# Patient Record
Sex: Male | Born: 1945 | Race: White | Hispanic: No | Marital: Married | Smoking: Former smoker
Health system: Southern US, Community
[De-identification: ages and names within clinical notes are randomized; demographics above are authoritative.]

## PROBLEM LIST (undated history)

## (undated) DIAGNOSIS — E785 Hyperlipidemia, unspecified: Secondary | ICD-10-CM

## (undated) DIAGNOSIS — R51 Headache: Secondary | ICD-10-CM

## (undated) DIAGNOSIS — K219 Gastro-esophageal reflux disease without esophagitis: Secondary | ICD-10-CM

## (undated) DIAGNOSIS — C4490 Unspecified malignant neoplasm of skin, unspecified: Secondary | ICD-10-CM

## (undated) DIAGNOSIS — R519 Headache, unspecified: Secondary | ICD-10-CM

## (undated) DIAGNOSIS — I10 Essential (primary) hypertension: Secondary | ICD-10-CM

## (undated) HISTORY — DX: Essential (primary) hypertension: I10

## (undated) HISTORY — DX: Hyperlipidemia, unspecified: E78.5

## (undated) HISTORY — PX: CHOLECYSTECTOMY: SHX55

## (undated) HISTORY — PX: CEREBRAL ANEURYSM REPAIR: SHX164

## (undated) HISTORY — DX: Headache, unspecified: R51.9

## (undated) HISTORY — DX: Unspecified malignant neoplasm of skin, unspecified: C44.90

## (undated) HISTORY — DX: Headache: R51

## (undated) HISTORY — DX: Gastro-esophageal reflux disease without esophagitis: K21.9

---
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
## 1998-08-28 ENCOUNTER — Ambulatory Visit (HOSPITAL_BASED_OUTPATIENT_CLINIC_OR_DEPARTMENT_OTHER): Admission: RE | Admit: 1998-08-28 | Discharge: 1998-08-28 | Payer: Self-pay | Admitting: Plastic Surgery

## 2001-10-20 ENCOUNTER — Other Ambulatory Visit: Admission: RE | Admit: 2001-10-20 | Discharge: 2001-10-20 | Payer: Self-pay | Admitting: Internal Medicine

## 2002-01-19 ENCOUNTER — Ambulatory Visit (HOSPITAL_COMMUNITY): Admission: RE | Admit: 2002-01-19 | Discharge: 2002-01-19 | Payer: Self-pay | Admitting: Internal Medicine

## 2002-08-22 ENCOUNTER — Ambulatory Visit (HOSPITAL_COMMUNITY): Admission: RE | Admit: 2002-08-22 | Discharge: 2002-08-22 | Payer: Self-pay | Admitting: Family Medicine

## 2002-08-22 ENCOUNTER — Encounter: Payer: Self-pay | Admitting: Family Medicine

## 2002-08-29 ENCOUNTER — Ambulatory Visit (HOSPITAL_COMMUNITY): Admission: RE | Admit: 2002-08-29 | Discharge: 2002-08-29 | Payer: Self-pay | Admitting: Family Medicine

## 2002-08-29 ENCOUNTER — Encounter: Payer: Self-pay | Admitting: Family Medicine

## 2002-09-05 ENCOUNTER — Ambulatory Visit (HOSPITAL_COMMUNITY): Admission: RE | Admit: 2002-09-05 | Discharge: 2002-09-05 | Payer: Self-pay | Admitting: Family Medicine

## 2002-09-05 ENCOUNTER — Encounter: Payer: Self-pay | Admitting: Family Medicine

## 2002-09-12 ENCOUNTER — Encounter (HOSPITAL_COMMUNITY): Admission: RE | Admit: 2002-09-12 | Discharge: 2002-10-12 | Payer: Self-pay | Admitting: *Deleted

## 2002-12-06 ENCOUNTER — Ambulatory Visit (HOSPITAL_COMMUNITY): Admission: RE | Admit: 2002-12-06 | Discharge: 2002-12-06 | Payer: Self-pay | Admitting: Family Medicine

## 2002-12-06 ENCOUNTER — Encounter: Payer: Self-pay | Admitting: Family Medicine

## 2004-04-29 ENCOUNTER — Ambulatory Visit: Payer: Self-pay | Admitting: Internal Medicine

## 2004-05-01 ENCOUNTER — Ambulatory Visit (HOSPITAL_COMMUNITY): Admission: RE | Admit: 2004-05-01 | Discharge: 2004-05-01 | Payer: Self-pay

## 2004-07-11 ENCOUNTER — Emergency Department: Payer: Self-pay | Admitting: General Practice

## 2004-07-17 ENCOUNTER — Ambulatory Visit (HOSPITAL_COMMUNITY): Admission: RE | Admit: 2004-07-17 | Discharge: 2004-07-17 | Payer: Self-pay | Admitting: Family Medicine

## 2005-08-20 ENCOUNTER — Ambulatory Visit: Payer: Self-pay | Admitting: Internal Medicine

## 2005-08-24 ENCOUNTER — Ambulatory Visit: Payer: Self-pay | Admitting: Internal Medicine

## 2007-06-28 ENCOUNTER — Ambulatory Visit (HOSPITAL_COMMUNITY): Admission: RE | Admit: 2007-06-28 | Discharge: 2007-06-28 | Payer: Self-pay | Admitting: Internal Medicine

## 2007-06-28 ENCOUNTER — Ambulatory Visit: Payer: Self-pay | Admitting: Internal Medicine

## 2007-06-28 HISTORY — PX: COLONOSCOPY: SHX174

## 2010-09-16 NOTE — Op Note (Signed)
NAMEHAYDIN, [name] [name]                ACCOUNT NO.:  [id number]   MEDICAL RECORD NO.:  [id number]          PATIENT TYPE:  AMB   LOCATION:  DAY                           FACILITY:  [facility]   PHYSICIAN:  R. Roetta Sessions, M.D. DATE OF BIRTH:  Sep 18, 1945   DATE OF PROCEDURE:  06/28/2007  DATE OF DISCHARGE:                               OPERATIVE REPORT   PROCEDURE PERFORMED:  High risk screening colonoscopy.   INDICATIONS FOR PROCEDURE:  Patient is a 65 year old gentleman with  positive family history of colon cancer in his mom, who underwent his  last colonoscopy in 2003 where only left-sided diverticula were found.  He is devoid of any lower GI tract symptoms.  He is here for high-risk  screening.  This approach has discussed with the patient at length.  Potential risks, benefits, alternatives and limitations have been  reviewed, questions answered.  He is agreeable. Please see documentation  in the medical record.   PROCEDURE NOTE:  O2 saturation, blood pressure, pulse and respirations  were monitored throughout the entirety of procedure.   CONSCIOUS SEDATION:  Versed 6 mg IV, Demerol 125 mg IV in divided doses.   INSTRUMENT:  Pentax video chip system.   FINDINGS:  Digital rectal exam revealed no abnormalities.   ENDOSCOPIC FINDINGS:  The prep was adequate, relatively poor in the  ascending segment.  Colonic mucosa was surveyed from the rectosigmoid  junction through the left, transverse, right colon to the area of  appendiceal orifice, ileocecal valve and cecum.  These structures well  seen and photographed for the record.  From this level scope was slowly  withdrawn.  All previously mentioned mucosal surfaces were again seen.  The patient was noted to have sigmoid diverticula.  A thin coating of  stool lining the ascending segment which was difficult to wash away.  There is no obvious mucosal lesions seen in this segment.  The scope was  pulled down to the rectum where a thorough  examination of the rectal  mucosa including retroflex view of the anal verge demonstrated no  abnormalities.  The patient tolerated the procedure well, was reacted in  endoscopy.   IMPRESSION:  Normal rectum, left-sided diverticula.  Remainder of  colonic mucosa appeared normal, suboptimal prep of ascending segment  made exam more difficult in this area.   RECOMMENDATIONS:  1. Diverticulosis literature provided to Mr. Schnorr.  2. Return for repeat screening colonoscopy in five years.      Jonathon Bellows, M.D.  Electronically Signed     RMR/MEDQ  D:  06/28/2007  T:  06/29/2007  Job:  [id number]   cc:   Patrica Duel, M.D.  Fax: [phone]

## 2010-09-19 NOTE — Procedures (Signed)
   NAME:  [name] [name], [name] [name]                          ACCOUNT NO.:  [id number]   MEDICAL RECORD NO.:  [id number]                   PATIENT TYPE:  OUT   LOCATION:  RAD                                  FACILITY:  [facility]   PHYSICIAN:  Vida Roller, M.D.                DATE OF BIRTH:  [birth year]   DATE OF PROCEDURE:  09/12/2002  DATE OF DISCHARGE:                                    STRESS TEST   EXERCISE CARDIOLITE   INDICATION:  The patient is a 65 year old gentleman with no known coronary  artery disease and an abnormal chest x-ray with lung mass who presented for  evaluation of dyspnea on exertion and occasional shortness of breath at  rest.  He denies any chest discomfort or heart failure symptoms.  He may be  undergoing a biopsy procedure or surgery for lung mass and therefore will  need surgical clearance.  His cardiac risk factors include family history,  tobacco abuse, male sex, hypertension, and hyperlipidemia.   BASELINE DATA:  EKG shows sinus rhythm at 64 beats per minute with  nonspecific ST abnormalities.  Blood pressure is 142/80.   DESCRIPTION OF PROCEDURE:  The patient exercised for a total of eight  minutes 14 seconds to Bruce protocol stage 3 and 10.1 METS.  He denied any  cardiopulmonary symptoms.  This test was stopped secondary to fatigue.   STRESS DATA:  EKG showed a two-beat run of ventricular tachycardia about 30  seconds into recovery.  Maximum heart rate achieved was 149 beats per minute  which was 91% of predicted maximum.  Maximum blood pressure was 210/90.  EKG  showed no ischemic changes.   Final images and results are pending M.D. review.     Amy Mercy Riding, P.A. [facility]                     Vida Roller, M.D.    AB/MEDQ  D:  09/12/2002  T:  09/12/2002  Job:  [id number]

## 2010-09-19 NOTE — Group Therapy Note (Signed)
   NAMESAMARTH, OGLE                            ACCOUNT NO.:  [id number]   MEDICAL RECORD NO.:  [id number]                   PATIENT TYPE:  AMB   LOCATION:  DAY                                  FACILITY:  [facility]   PHYSICIAN:  Gerrit Friends. Rourk, M.D.               DATE OF BIRTH:  1945-08-03   DATE OF PROCEDURE:  DATE OF DISCHARGE:  01/19/2002                                   PROGRESS NOTE   INDICATIONS FOR PROCEDURE:  The patient is a 65 year old gentleman with a  positive family history of colorectal neoplasia.  He comes for high-risk  screening colonoscopy.  He is devoid of any lower GI tract symptoms.  Colonoscopy is now being done as a high-risk screening maneuver.  The  procedure has been discussed with the patient previously.  Potential risks,  benefits, and alternatives have been reviewed and questions answered and he  is agreeable.  Please see the documentation in medical record.  He is low-  risk for conscious sedation.   PROCEDURE NOTE:  O2 saturation, blood pressure, pulse, and respirations were  monitored throughout the entire procedure.   CONSCIOUS SEDATION:  Versed 3 mg IV, Demerol 75 mg IV in divided doses.   INSTRUMENT:  Olympus video chip colonoscope.   FINDINGS:  Digital rectal exam revealed no abnormalities.  Endoscopic prep  was good.   RECTAL AND COLON EXAMINATION:  The rectal mucosa including retroflexed view  of the anal verge revealed only internal hemorrhoids.   COLON:  The colonic mucosa was surveyed from the rectosigmoid junction  through the left transverse and right colon to the area to the area of the  appendiceal orifice, cecal valve, and cecum. These structures were well seen  and photographed for the records.  The patient was noted to have scattered  left-sided diverticula.  The remainder of the colonic mucosa to the cecum  appeared normal from the level of the cecum and ileocecal valve.  The scope  was slowly and cautiously withdrawn.  All  previously  mentioned mucosal  surfaces were again seen and no other abnormalities were observed. The  patient tolerated the procedure well and was reactive.   ENDOSCOPY IMPRESSION:  1. Internal hemorrhoids, otherwise normal rectum.  2. Left-sided diverticula and the remainder of colonic mucosa appeared     normal.   RECOMMENDATIONS:  1. Diverticulosis literature provided to the patient.  2. Repeat colonoscopy in five years.                                               Gerrit Friends. Rourk, M.D.   RMR/MEDQ  D:  01/19/2002  T:  01/23/2002  Job:  [id number]

## 2010-09-19 NOTE — H&P (Signed)
NAME:  [name] [name], [name] [name]                      ACCOUNT NO.:  [id number]   MEDICAL RECORD NO.:  [id number]                   PATIENT TYPE:   LOCATION:                                       FACILITY:   PHYSICIAN:  Gerrit Friends. Rourk, M.D.               DATE OF BIRTH:  05-27-1945   DATE OF ADMISSION:  DATE OF DISCHARGE:                                HISTORY & PHYSICAL   CHIEF COMPLAINT:  History of gastroesophageal reflux disease, positive  family history of colon cancer.  No prior imaging of his colon.   HISTORY OF PRESENT ILLNESS:  The patient is a pleasant 65 year old gentleman  followed primarily by Dr. Butch Penny who came to see me back today for  two year follow-up for gastroesophageal reflux disease.  He has a history of  complicated GERD with history of erosive reflux esophagitis with a peptic  stricture requiring dilation with a 56 French Maloney dilator back on Sep 17, 1998.  His reflux symptoms had been well controlled on Prilosec 20 mg  orally daily ever since.   He does not have any recurrent esophageal dysphagia.  He has not had any  melena or rectal bleeding.  It is notable his mother underwent a resection  for colon cancer early 63s and the patient has not yet had his colon imaged.   PAST MEDICAL HISTORY:  Significant for hypertension, depression, anxiety,  neurosis, chronic headaches, history of GERD.   PAST SURGICAL HISTORY:  Appendectomy, tonsillectomy, craniotomy for cerebral  aneurysm several years ago at [facility] with excellent recovery.   CURRENT MEDICATIONS:  1. ASA 325 mg daily.  2. Hydrocodone p.r.n.  3. Hyzaar once daily.  4. Prilosec 20 mg daily.  5. Lipitor 80 mg daily.  6. Welchol 3750 mg daily.   ALLERGIES:  No known drug allergies.   FAMILY HISTORY:  As noted above.   SOCIAL HISTORY:  The patient has been married for 25 years.  He has four  children.  He is retired from [employer].   REVIEW OF SYMPTOMS:  As in history  of present illness.  No dyspnea.  No  chest pain on exertion.  No major weight change, although he is 4 pounds  since Sep 12, 1998.   PHYSICAL EXAMINATION:  GENERAL:  Pleasant 65 year old gentleman resting  comfortably.  VITAL SIGNS:  Weight 193, blood pressure 122/80, pulse 68.  SKIN:  Warm and dry.  No lesions.  HEENT:  No scleral icterus.  NECK:  JVD is not prominent.  CHEST:  Lungs are clear to auscultation.  CARDIAC:  Regular rate and rhythm without murmur, gallop, rub.  ABDOMEN:  Nondistended.  Right lower quadrant surgical scar well healed.  Positive bowel sounds.  Soft, nontender.  No appreciable mass or  organomegaly.  EXTREMITIES:  No edema.  RECTAL:  Deferred until time of colonoscopy.   IMPRESSION:  The patient is a pleasant 65 year old  gentleman with positive  family history of colorectal neoplasia who is overdue for high risk colon  cancer screening.  This is very important.  Potential risks, benefits, and  alternatives have been reviewed.  He needs to have a colonoscopy in the near  future for screening purposes.  I have discussed this approach with the  patient.  The patient is at low risk for conscious sedation.  Of note, he  received Versed 20 mg intravenous, Demerol 62.5 mg intravenous back in 2000,  did very well.   His gastroesophageal reflux disease symptoms are well controlled on Prilosec  20 mg orally daily.  He does not have any alarm symptoms.  Would plan for  him to continue to adhere to an antireflux lifestyle/diet.  He should stay  on Prilosec indefinitely.  Will make further recommendations after  colonoscopy has been performed.                                                Gerrit Friends. Rourk, M.D.    RMR/MEDQ  D:  12/14/2001  T:  12/14/2001  Job:  [id number]   cc:   Angus G. Renard Matter, M.D.

## 2010-12-30 ENCOUNTER — Ambulatory Visit (HOSPITAL_COMMUNITY)
Admission: RE | Admit: 2010-12-30 | Discharge: 2010-12-30 | Disposition: A | Discharge status: Home / Self Care | Payer: MEDICARE | Source: Ambulatory Visit | Attending: Internal Medicine | Admitting: Internal Medicine

## 2010-12-30 ENCOUNTER — Other Ambulatory Visit (HOSPITAL_COMMUNITY): Payer: Self-pay | Admitting: Internal Medicine

## 2010-12-30 DIAGNOSIS — M79673 Pain in unspecified foot: Secondary | ICD-10-CM

## 2010-12-30 DIAGNOSIS — M25579 Pain in unspecified ankle and joints of unspecified foot: Secondary | ICD-10-CM | POA: Insufficient documentation

## 2010-12-30 DIAGNOSIS — M7730 Calcaneal spur, unspecified foot: Secondary | ICD-10-CM | POA: Insufficient documentation

## 2011-09-09 DIAGNOSIS — I10 Essential (primary) hypertension: Secondary | ICD-10-CM | POA: Diagnosis not present

## 2011-09-15 DIAGNOSIS — Z808 Family history of malignant neoplasm of other organs or systems: Secondary | ICD-10-CM | POA: Diagnosis not present

## 2011-09-15 DIAGNOSIS — L821 Other seborrheic keratosis: Secondary | ICD-10-CM | POA: Diagnosis not present

## 2011-09-15 DIAGNOSIS — L570 Actinic keratosis: Secondary | ICD-10-CM | POA: Diagnosis not present

## 2011-12-16 DIAGNOSIS — I10 Essential (primary) hypertension: Secondary | ICD-10-CM | POA: Diagnosis not present

## 2012-01-22 DIAGNOSIS — Z23 Encounter for immunization: Secondary | ICD-10-CM | POA: Diagnosis not present

## 2012-01-25 DIAGNOSIS — H5200 Hypermetropia, unspecified eye: Secondary | ICD-10-CM | POA: Diagnosis not present

## 2012-01-25 DIAGNOSIS — H524 Presbyopia: Secondary | ICD-10-CM | POA: Diagnosis not present

## 2012-01-25 DIAGNOSIS — I10 Essential (primary) hypertension: Secondary | ICD-10-CM | POA: Diagnosis not present

## 2012-01-25 DIAGNOSIS — H52229 Regular astigmatism, unspecified eye: Secondary | ICD-10-CM | POA: Diagnosis not present

## 2012-03-03 DIAGNOSIS — D485 Neoplasm of uncertain behavior of skin: Secondary | ICD-10-CM | POA: Diagnosis not present

## 2012-03-03 DIAGNOSIS — L821 Other seborrheic keratosis: Secondary | ICD-10-CM | POA: Diagnosis not present

## 2012-03-03 DIAGNOSIS — C44621 Squamous cell carcinoma of skin of unspecified upper limb, including shoulder: Secondary | ICD-10-CM | POA: Diagnosis not present

## 2012-03-03 DIAGNOSIS — D239 Other benign neoplasm of skin, unspecified: Secondary | ICD-10-CM | POA: Diagnosis not present

## 2012-03-03 DIAGNOSIS — L570 Actinic keratosis: Secondary | ICD-10-CM | POA: Diagnosis not present

## 2012-03-03 DIAGNOSIS — D0460 Carcinoma in situ of skin of unspecified upper limb, including shoulder: Secondary | ICD-10-CM | POA: Diagnosis not present

## 2012-03-03 DIAGNOSIS — Z85828 Personal history of other malignant neoplasm of skin: Secondary | ICD-10-CM | POA: Diagnosis not present

## 2012-04-01 DIAGNOSIS — Z79899 Other long term (current) drug therapy: Secondary | ICD-10-CM | POA: Diagnosis not present

## 2012-04-01 DIAGNOSIS — Z125 Encounter for screening for malignant neoplasm of prostate: Secondary | ICD-10-CM | POA: Diagnosis not present

## 2012-04-01 DIAGNOSIS — E785 Hyperlipidemia, unspecified: Secondary | ICD-10-CM | POA: Diagnosis not present

## 2012-04-01 DIAGNOSIS — I10 Essential (primary) hypertension: Secondary | ICD-10-CM | POA: Diagnosis not present

## 2012-04-07 DIAGNOSIS — C44621 Squamous cell carcinoma of skin of unspecified upper limb, including shoulder: Secondary | ICD-10-CM | POA: Diagnosis not present

## 2012-04-07 DIAGNOSIS — D0460 Carcinoma in situ of skin of unspecified upper limb, including shoulder: Secondary | ICD-10-CM | POA: Diagnosis not present

## 2012-04-08 DIAGNOSIS — Z1212 Encounter for screening for malignant neoplasm of rectum: Secondary | ICD-10-CM | POA: Diagnosis not present

## 2012-04-08 DIAGNOSIS — E785 Hyperlipidemia, unspecified: Secondary | ICD-10-CM | POA: Diagnosis not present

## 2012-04-08 DIAGNOSIS — I10 Essential (primary) hypertension: Secondary | ICD-10-CM | POA: Diagnosis not present

## 2012-04-08 DIAGNOSIS — I729 Aneurysm of unspecified site: Secondary | ICD-10-CM | POA: Diagnosis not present

## 2012-05-13 DIAGNOSIS — J069 Acute upper respiratory infection, unspecified: Secondary | ICD-10-CM | POA: Diagnosis not present

## 2012-06-20 DIAGNOSIS — D485 Neoplasm of uncertain behavior of skin: Secondary | ICD-10-CM | POA: Diagnosis not present

## 2012-06-20 DIAGNOSIS — C44319 Basal cell carcinoma of skin of other parts of face: Secondary | ICD-10-CM | POA: Diagnosis not present

## 2012-07-26 DIAGNOSIS — C44319 Basal cell carcinoma of skin of other parts of face: Secondary | ICD-10-CM | POA: Diagnosis not present

## 2012-09-01 DIAGNOSIS — L570 Actinic keratosis: Secondary | ICD-10-CM | POA: Diagnosis not present

## 2012-09-01 DIAGNOSIS — Z85828 Personal history of other malignant neoplasm of skin: Secondary | ICD-10-CM | POA: Diagnosis not present

## 2012-10-11 DIAGNOSIS — I10 Essential (primary) hypertension: Secondary | ICD-10-CM | POA: Diagnosis not present

## 2012-10-11 DIAGNOSIS — R51 Headache: Secondary | ICD-10-CM | POA: Diagnosis not present

## 2012-10-28 ENCOUNTER — Encounter: Payer: Self-pay | Admitting: Internal Medicine

## 2012-10-31 ENCOUNTER — Encounter: Payer: Self-pay | Admitting: Internal Medicine

## 2012-10-31 ENCOUNTER — Ambulatory Visit (INDEPENDENT_AMBULATORY_CARE_PROVIDER_SITE_OTHER): Payer: MEDICARE | Admitting: Gastroenterology

## 2012-10-31 ENCOUNTER — Encounter: Payer: Self-pay | Admitting: Gastroenterology

## 2012-10-31 ENCOUNTER — Other Ambulatory Visit: Payer: Self-pay | Admitting: Internal Medicine

## 2012-10-31 VITALS — BP 142/75 | HR 70 | Temp 98.2°F | Ht 69.0 in | Wt 198.4 lb

## 2012-10-31 DIAGNOSIS — K219 Gastro-esophageal reflux disease without esophagitis: Secondary | ICD-10-CM

## 2012-10-31 DIAGNOSIS — Z800 Family history of malignant neoplasm of digestive organs: Secondary | ICD-10-CM

## 2012-10-31 MED ORDER — PEG 3350-KCL-NA BICARB-NACL 420 G PO SOLR: 4000.0000 mL | ORAL | Status: DC

## 2012-10-31 NOTE — Patient Instructions (Addendum)
1. We will let you know if you need to have another endoscopy after I have reviewed your records. 2. Colonoscopy as scheduled. Please see separate instructions.

## 2012-10-31 NOTE — Progress Notes (Signed)
CC PCP 

## 2012-10-31 NOTE — Assessment & Plan Note (Signed)
Retrieve copy of last endoscopy. Discussed chronic GERD with patient. If he has not had an adequate endoscopy to rule out Barrett's esophagus he should have one. Further recommendations to follow once records have been obtained. Patient is in agreement.

## 2012-10-31 NOTE — Assessment & Plan Note (Signed)
Due for followup colonoscopy given family history of colon cancer in a first-degree relative and suboptimal prep previously.  I have discussed the risks, alternatives, benefits with regards to but not limited to the risk of reaction to medication, bleeding, infection, perforation and the patient is agreeable to proceed. Written consent to be obtained.

## 2012-10-31 NOTE — Progress Notes (Signed)
Primary Care Physician:  Carylon Perches, MD  Primary Gastroenterologist:  Roetta Sessions, MD   Chief Complaint  Patient presents with  . Colonoscopy    HPI:  [name] [name] is a 67 y.o. male here to schedule his 5 year followup colonoscopy. His mother had colon cancer. Patient's last colonoscopy was in February 2009. He had left-sided diverticula. He had a suboptimal prep of the ascending colon. Patient reports he had esophageal dilation many years ago after presenting with food impaction. He has a history of chronic GERD but has done very well on omeprazole. Denies recurrent dysphagia, heartburn. No melena, brbpr. BM regular. No abdominal pain. No anorexia or weight loss.  Current Outpatient Prescriptions  Medication Sig Dispense Refill  . aspirin 325 MG tablet Take 325 mg by mouth daily.      Marland Kitchen CIALIS 20 MG tablet Take 20 mg by mouth daily as needed.       Marland Kitchen HYDROcodone-acetaminophen (NORCO/VICODIN) 5-325 MG per tablet Take 1 tablet by mouth every 8 (eight) hours as needed.       Marland Kitchen losartan-hydrochlorothiazide (HYZAAR) 100-25 MG per tablet Take 1 tablet by mouth daily.       Marland Kitchen lovastatin (MEVACOR) 20 MG tablet Take 20 mg by mouth at bedtime.       Marland Kitchen omeprazole (PRILOSEC) 20 MG capsule Take 20 mg by mouth daily.        No current facility-administered medications for this visit.    Allergies as of 10/31/2012  . (No Known Allergies)    Past Medical History  Diagnosis Date  . Hypertension   . Hyperlipidemia   . GERD (gastroesophageal reflux disease)   . Frequent headaches   . Skin cancer     Squamous, basal-multiple    Past Surgical History  Procedure Laterality Date  . Colonoscopy  06/28/2007    ION:GEXBMW rectum, left-sided diverticula.  Remainder of colonic mucosa appeared normal, suboptimal prep of ascending segment made exam more difficult in this area.  . Cerebral aneurysm repair      age 42    Family History  Problem Relation Age of Onset  . Colon cancer Mother     52     History   Social History  . Marital Status: Married    Spouse Name: N/A    Number of Children: 3  . Years of Education: N/A   Occupational History  . retired    Social History Main Topics  . Smoking status: Never Smoker   . Smokeless tobacco: Not on file  . Alcohol Use: No  . Drug Use: No  . Sexually Active: Not on file   Other Topics Concern  . Not on file   Social History Narrative  . No narrative on file      ROS:  General: Negative for anorexia, weight loss, fever, chills, fatigue, weakness. Eyes: Negative for vision changes.  ENT: Negative for hoarseness, difficulty swallowing , nasal congestion. CV: Negative for chest pain, angina, palpitations, dyspnea on exertion, peripheral edema.  Respiratory: Negative for dyspnea at rest, dyspnea on exertion, cough, sputum, wheezing.  GI: See history of present illness. GU:  Negative for dysuria, hematuria, urinary incontinence, urinary frequency, nocturnal urination.  MS: Negative for joint pain, low back pain.  Derm: Negative for rash or itching.  Neuro: Negative for weakness, abnormal sensation, seizure, frequent headaches, memory loss, confusion.  Psych: Negative for anxiety, depression, suicidal ideation, hallucinations.  Endo: Negative for unusual weight change.  Heme: Negative for bruising or bleeding.  Allergy: Negative for rash or hives.    Physical Examination:  BP 142/75  Pulse 70  Temp(Src) 98.2 F (36.8 C) (Oral)  Ht 5\' 9"  (1.753 m)  Wt 198 lb 6.4 oz (89.994 kg)  BMI 29.29 kg/m2   General: Well-nourished, well-developed in no acute distress.  Head: Normocephalic, atraumatic.   Eyes: Conjunctiva pink, no icterus. Mouth: Oropharyngeal mucosa moist and pink , no lesions erythema or exudate. Neck: Supple without thyromegaly, masses, or lymphadenopathy.  Lungs: Clear to auscultation bilaterally.  Heart: Regular rate and rhythm, no murmurs rubs or gallops.  Abdomen: Bowel sounds are normal,  nontender, nondistended, no hepatosplenomegaly or masses, no abdominal bruits or    hernia , no rebound or guarding.   Rectal: Deferred Extremities: No lower extremity edema. No clubbing or deformities.  Neuro: Alert and oriented x 4 , grossly normal neurologically.  Skin: Warm and dry, no rash or jaundice.   Psych: Alert and cooperative, normal mood and affect.

## 2012-11-01 ENCOUNTER — Telehealth: Payer: Self-pay | Admitting: Gastroenterology

## 2012-11-01 NOTE — Telephone Encounter (Signed)
Tried to call pt- LMOM 

## 2012-11-01 NOTE — Telephone Encounter (Signed)
Pt is aware and stated it was ok to add on egd when he has his tcs. He said his last one was probably over 20 years ago.  Benedetto Goad, please add on egd.

## 2012-11-01 NOTE — Telephone Encounter (Signed)
ITS ALREADY SCHEDULED HAS A POSS

## 2012-11-01 NOTE — Telephone Encounter (Signed)
Please let patient know that [facility] could not find copy of old EGD report, just the colonoscopy.   Please offer EGD at time of colonoscopy to rule out Barrett's as discussed at time of OV.

## 2012-11-03 ENCOUNTER — Encounter (HOSPITAL_COMMUNITY): Payer: Self-pay | Admitting: Pharmacy Technician

## 2012-11-18 ENCOUNTER — Encounter (HOSPITAL_COMMUNITY)
Admission: RE | Disposition: A | Discharge status: Home / Self Care | Payer: Self-pay | Source: Ambulatory Visit | Attending: Internal Medicine

## 2012-11-18 ENCOUNTER — Ambulatory Visit (HOSPITAL_COMMUNITY)
Admission: RE | Admit: 2012-11-18 | Discharge: 2012-11-18 | Disposition: A | Discharge status: Home / Self Care | Payer: MEDICARE | Source: Ambulatory Visit | Attending: Internal Medicine | Admitting: Internal Medicine

## 2012-11-18 ENCOUNTER — Encounter (HOSPITAL_COMMUNITY): Payer: Self-pay | Admitting: *Deleted

## 2012-11-18 DIAGNOSIS — Z85828 Personal history of other malignant neoplasm of skin: Secondary | ICD-10-CM | POA: Diagnosis not present

## 2012-11-18 DIAGNOSIS — K648 Other hemorrhoids: Secondary | ICD-10-CM

## 2012-11-18 DIAGNOSIS — E785 Hyperlipidemia, unspecified: Secondary | ICD-10-CM | POA: Insufficient documentation

## 2012-11-18 DIAGNOSIS — K219 Gastro-esophageal reflux disease without esophagitis: Secondary | ICD-10-CM | POA: Insufficient documentation

## 2012-11-18 DIAGNOSIS — Z79899 Other long term (current) drug therapy: Secondary | ICD-10-CM | POA: Insufficient documentation

## 2012-11-18 DIAGNOSIS — R933 Abnormal findings on diagnostic imaging of other parts of digestive tract: Secondary | ICD-10-CM

## 2012-11-18 DIAGNOSIS — Z800 Family history of malignant neoplasm of digestive organs: Secondary | ICD-10-CM | POA: Insufficient documentation

## 2012-11-18 DIAGNOSIS — Z1211 Encounter for screening for malignant neoplasm of colon: Secondary | ICD-10-CM

## 2012-11-18 DIAGNOSIS — K5730 Diverticulosis of large intestine without perforation or abscess without bleeding: Secondary | ICD-10-CM | POA: Diagnosis not present

## 2012-11-18 DIAGNOSIS — D131 Benign neoplasm of stomach: Secondary | ICD-10-CM | POA: Diagnosis not present

## 2012-11-18 DIAGNOSIS — Z7982 Long term (current) use of aspirin: Secondary | ICD-10-CM | POA: Diagnosis not present

## 2012-11-18 DIAGNOSIS — K319 Disease of stomach and duodenum, unspecified: Secondary | ICD-10-CM | POA: Diagnosis not present

## 2012-11-18 DIAGNOSIS — K449 Diaphragmatic hernia without obstruction or gangrene: Secondary | ICD-10-CM | POA: Diagnosis not present

## 2012-11-18 DIAGNOSIS — I10 Essential (primary) hypertension: Secondary | ICD-10-CM | POA: Insufficient documentation

## 2012-11-18 HISTORY — PX: ESOPHAGOGASTRODUODENOSCOPY: SHX5428

## 2012-11-18 HISTORY — PX: COLONOSCOPY: SHX5424

## 2012-11-18 SURGERY — COLONOSCOPY
Anesthesia: Moderate Sedation

## 2012-11-18 MED ORDER — BUTAMBEN-TETRACAINE-BENZOCAINE 2-2-14 % EX AERO
INHALATION_SPRAY | CUTANEOUS | Status: DC | PRN
  Administered 2012-11-18: 2 via TOPICAL

## 2012-11-18 MED ORDER — MEPERIDINE HCL 100 MG/ML IJ SOLN
INTRAMUSCULAR | Status: AC
  Filled 2012-11-18: qty 1

## 2012-11-18 MED ORDER — SODIUM CHLORIDE 0.9 % IV SOLN
INTRAVENOUS | Status: DC
  Administered 2012-11-18: 08:00:00 via INTRAVENOUS

## 2012-11-18 MED ORDER — MIDAZOLAM HCL 5 MG/5ML IJ SOLN
INTRAMUSCULAR | Status: AC
  Filled 2012-11-18: qty 10

## 2012-11-18 MED ORDER — ONDANSETRON HCL 4 MG/2ML IJ SOLN
INTRAMUSCULAR | Status: DC | PRN
  Administered 2012-11-18: 4 mg via INTRAVENOUS

## 2012-11-18 MED ORDER — MIDAZOLAM HCL 5 MG/5ML IJ SOLN
INTRAMUSCULAR | Status: DC | PRN
  Administered 2012-11-18: 2 mg via INTRAVENOUS
  Administered 2012-11-18: 1 mg via INTRAVENOUS
  Administered 2012-11-18: 2 mg via INTRAVENOUS

## 2012-11-18 MED ORDER — MEPERIDINE HCL 100 MG/ML IJ SOLN
INTRAMUSCULAR | Status: DC | PRN
  Administered 2012-11-18: 25 mg via INTRAVENOUS
  Administered 2012-11-18: 50 mg via INTRAVENOUS
  Administered 2012-11-18: 25 mg via INTRAVENOUS

## 2012-11-18 MED ORDER — ONDANSETRON HCL 4 MG/2ML IJ SOLN
INTRAMUSCULAR | Status: AC
  Filled 2012-11-18: qty 2

## 2012-11-18 MED ORDER — STERILE WATER FOR IRRIGATION IR SOLN
Status: DC | PRN
  Administered 2012-11-18: 09:00:00

## 2012-11-18 NOTE — H&P (View-Only) (Signed)
Primary Care Physician:  FAGAN,ROY, MD  Primary Gastroenterologist:  Michael Rourk, MD   Chief Complaint  Patient presents with  . Colonoscopy    HPI:  [name] [name] is a 66 y.o. male here to schedule his 5 year followup colonoscopy. His mother had colon cancer. Patient's last colonoscopy was in February 2009. He had left-sided diverticula. He had a suboptimal prep of the ascending colon. Patient reports he had esophageal dilation many years ago after presenting with food impaction. He has a history of chronic GERD but has done very well on omeprazole. Denies recurrent dysphagia, heartburn. No melena, brbpr. BM regular. No abdominal pain. No anorexia or weight loss.  Current Outpatient Prescriptions  Medication Sig Dispense Refill  . aspirin 325 MG tablet Take 325 mg by mouth daily.      . CIALIS 20 MG tablet Take 20 mg by mouth daily as needed.       . HYDROcodone-acetaminophen (NORCO/VICODIN) 5-325 MG per tablet Take 1 tablet by mouth every 8 (eight) hours as needed.       . losartan-hydrochlorothiazide (HYZAAR) 100-25 MG per tablet Take 1 tablet by mouth daily.       . lovastatin (MEVACOR) 20 MG tablet Take 20 mg by mouth at bedtime.       . omeprazole (PRILOSEC) 20 MG capsule Take 20 mg by mouth daily.        No current facility-administered medications for this visit.    Allergies as of 10/31/2012  . (No Known Allergies)    Past Medical History  Diagnosis Date  . Hypertension   . Hyperlipidemia   . GERD (gastroesophageal reflux disease)   . Frequent headaches   . Skin cancer     Squamous, basal-multiple    Past Surgical History  Procedure Laterality Date  . Colonoscopy  06/28/2007    RMR:Normal rectum, left-sided diverticula.  Remainder of colonic mucosa appeared normal, suboptimal prep of ascending segment made exam more difficult in this area.  . Cerebral aneurysm repair      age 31    Family History  Problem Relation Age of Onset  . Colon cancer Mother     70     History   Social History  . Marital Status: Married    Spouse Name: N/A    Number of Children: 3  . Years of Education: N/A   Occupational History  . retired    Social History Main Topics  . Smoking status: Never Smoker   . Smokeless tobacco: Not on file  . Alcohol Use: No  . Drug Use: No  . Sexually Active: Not on file   Other Topics Concern  . Not on file   Social History Narrative  . No narrative on file      ROS:  General: Negative for anorexia, weight loss, fever, chills, fatigue, weakness. Eyes: Negative for vision changes.  ENT: Negative for hoarseness, difficulty swallowing , nasal congestion. CV: Negative for chest pain, angina, palpitations, dyspnea on exertion, peripheral edema.  Respiratory: Negative for dyspnea at rest, dyspnea on exertion, cough, sputum, wheezing.  GI: See history of present illness. GU:  Negative for dysuria, hematuria, urinary incontinence, urinary frequency, nocturnal urination.  MS: Negative for joint pain, low back pain.  Derm: Negative for rash or itching.  Neuro: Negative for weakness, abnormal sensation, seizure, frequent headaches, memory loss, confusion.  Psych: Negative for anxiety, depression, suicidal ideation, hallucinations.  Endo: Negative for unusual weight change.  Heme: Negative for bruising or bleeding.   Allergy: Negative for rash or hives.    Physical Examination:  BP 142/75  Pulse 70  Temp(Src) 98.2 F (36.8 C) (Oral)  Ht 5' 9" (1.753 m)  Wt 198 lb 6.4 oz (89.994 kg)  BMI 29.29 kg/m2   General: Well-nourished, well-developed in no acute distress.  Head: Normocephalic, atraumatic.   Eyes: Conjunctiva pink, no icterus. Mouth: Oropharyngeal mucosa moist and pink , no lesions erythema or exudate. Neck: Supple without thyromegaly, masses, or lymphadenopathy.  Lungs: Clear to auscultation bilaterally.  Heart: Regular rate and rhythm, no murmurs rubs or gallops.  Abdomen: Bowel sounds are normal,  nontender, nondistended, no hepatosplenomegaly or masses, no abdominal bruits or    hernia , no rebound or guarding.   Rectal: Deferred Extremities: No lower extremity edema. No clubbing or deformities.  Neuro: Alert and oriented x 4 , grossly normal neurologically.  Skin: Warm and dry, no rash or jaundice.   Psych: Alert and cooperative, normal mood and affect.     

## 2012-11-18 NOTE — Interval H&P Note (Signed)
History and Physical Interval Note:  11/18/2012 8:44 AM  Xai Len Blalock  has presented today for surgery, with the diagnosis of FAMILY HX OF COLON RECTAL CANCER AND GERD  The various methods of treatment have been discussed with the patient and family. After consideration of risks, benefits and other options for treatment, the patient has consented to  Procedure(s) with comments: COLONOSCOPY (N/A) - 8:15 ESOPHAGOGASTRODUODENOSCOPY (EGD) (N/A) as a surgical intervention .  The patient's history has been reviewed, patient examined, no change in status, stable for surgery.  I have reviewed the patient's chart and labs.  Questions were answered to the patient's satisfaction.     No change. No dysphagia. Long-standing GERD. No documentation of any prior EGD. Positive family history colon cancer. EGD and colonoscopy now being done.The risks, benefits, limitations, imponderables and alternatives regarding both EGD and colonoscopy have been reviewed with the patient. Questions have been answered. All parties agreeable.    Eula Listen

## 2012-11-18 NOTE — Op Note (Signed)
[facility] [address] [place], [address]   COLONOSCOPY PROCEDURE REPORT  PATIENT: [name], [name]  MR#:         [id number] BIRTHDATE: 09/21/1945 , 66  yrs. old GENDER: Male ENDOSCOPIST: R.  Roetta Sessions, MD FACP FACG REFERRED BY:  Carylon Perches, M.D. PROCEDURE DATE:  11/18/2012 PROCEDURE:    high risk screening colonoscopy  INDICATIONS: Positive family history of colon cancer  INFORMED CONSENT:  The risks, benefits, alternatives and imponderables including but not limited to bleeding, perforation as well as the possibility of a missed lesion have been reviewed.  The potential for biopsy, lesion removal, etc. have also been discussed.  Questions have been answered.  All parties agreeable. Please see the history and physical in the medical record for more information.  MEDICATIONS: Versed 5 mg IV and Demerol 100 mg IV in divided doses. Zofran 4 mg IV.  DESCRIPTION OF PROCEDURE:  After a digital rectal exam was performed, the EG-[id number] ([id number]) and EC-[id number] ([id number]) colonoscope was advanced from the anus through the rectum and colon to the area of the cecum, ileocecal valve and appendiceal orifice. The cecum was deeply intubated.  These structures were well-seen and photographed for the record.  From the level of the cecum and ileocecal valve, the scope was slowly and cautiously withdrawn. The mucosal surfaces were carefully surveyed utilizing scope tip deflection to facilitate fold flattening as needed.  The scope was pulled down into the rectum where a thorough examination including retroflexion was performed.    FINDINGS:  Adequate preparation. Internal hemorrhoids; otherwise normal rectum. Sigmoid diverticula; the remainder of the colonic mucosa appeared normal.  THERAPEUTIC / DIAGNOSTIC MANEUVERS PERFORMED:  None  COMPLICATIONS: none  CECAL WITHDRAWAL TIME:  10 minutes  IMPRESSION:  colonic diverticulosis.  RECOMMENDATIONS: Consider one more  screening colonoscopy in 5 years if overall health remains good   _______________________________ eSigned:  R. Roetta Sessions, MD FACP [facility] 11/18/2012 9:35 AM   CC:

## 2012-11-18 NOTE — Op Note (Signed)
[facility] [address] [place], [address]   ENDOSCOPY PROCEDURE REPORT  PATIENT: [name] [name], [name] [name]  MR#: [id number] BIRTHDATE: 06/02/1945 , 66  yrs. old GENDER: Male ENDOSCOPIST: R.  Roetta Sessions, MD FACP FACG REFERRED BY:  Carylon Perches, M.D. PROCEDURE DATE:  11/18/2012 PROCEDURE:     EGD with gastric biopsy  INDICATIONS:     Long-standing GERD  INFORMED CONSENT:   The risks, benefits, limitations, alternatives and imponderables have been discussed.  The potential for biopsy, esophogeal dilation, etc. have also been reviewed.  Questions have been answered.  All parties agreeable.  Please see the history and physical in the medical record for more information.  MEDICATIONS:    Versed 5 mg IV and Demerol 75 mg IV in divided doses.  Cetacaine spray. Zofran 4 mg IV  DESCRIPTION OF PROCEDURE:   The XB-1478G ([id number])  endoscope was introduced through the mouth and advanced to the second portion of the duodenum without difficulty or limitations.  The mucosal surfaces were surveyed very carefully during advancement of the scope and upon withdrawal.  Retroflexion view of the proximal stomach and esophagogastric junction was performed.      FINDINGS:   Normal appearing tubular esophagus. Normal esophageal mucosa. Somewhat patulous EG junction. Stomach empty. Small hiatal hernia. Scattered submucosal gastric petechia. No ulcer or infiltrating process. Patent pylorus. Normal first and second portion of the duodenum  THERAPEUTIC / DIAGNOSTIC MANEUVERS PERFORMED:  Biopsies abnormal gastric mucosa taken for histologic study   COMPLICATIONS:  None  IMPRESSION:   Normal esophagus/patulous EG junction. Small hiatal hernia. Abnormal gastric mucosa of uncertain significance-status post biopsy  RECOMMENDATIONS:   Followup on pathology. Continue omeprazole daily. See colonoscopy report.    _______________________________ R. Roetta Sessions, MD FACP [facility] eSigned:   R. Roetta Sessions, MD FACP [facility] 11/18/2012 9:07 AM     CC:

## 2012-11-22 ENCOUNTER — Encounter (HOSPITAL_COMMUNITY): Payer: Self-pay | Admitting: Internal Medicine

## 2012-11-25 ENCOUNTER — Encounter: Payer: Self-pay | Admitting: Internal Medicine

## 2013-01-03 ENCOUNTER — Telehealth: Payer: Self-pay

## 2013-01-03 NOTE — Telephone Encounter (Signed)
Pt called- he had his procedures done 6 weeks ago and has had bloating on and off for 6 weeks. He has daily bm's which are normal, no pain or fever. He just feels "uncomfortable". He has tried gas-x and bean-o and they have not helped. He is not taking any additional fiber. He wants to know if there is anything else he can try. Please advise.

## 2013-01-04 NOTE — Telephone Encounter (Signed)
Pt is aware.  

## 2013-01-04 NOTE — Telephone Encounter (Signed)
Probably has nothing to do with his recent endoscopy. He could try digest advantage for gas bloat or other similar probiotic to combat bloating symptoms. Continue omeprazole.

## 2013-02-01 DIAGNOSIS — L570 Actinic keratosis: Secondary | ICD-10-CM | POA: Diagnosis not present

## 2013-02-07 DIAGNOSIS — Z23 Encounter for immunization: Secondary | ICD-10-CM | POA: Diagnosis not present

## 2013-03-23 ENCOUNTER — Other Ambulatory Visit: Payer: Self-pay | Admitting: Dermatology

## 2013-03-23 DIAGNOSIS — D485 Neoplasm of uncertain behavior of skin: Secondary | ICD-10-CM | POA: Diagnosis not present

## 2013-03-23 DIAGNOSIS — Z85828 Personal history of other malignant neoplasm of skin: Secondary | ICD-10-CM | POA: Diagnosis not present

## 2013-03-23 DIAGNOSIS — C44611 Basal cell carcinoma of skin of unspecified upper limb, including shoulder: Secondary | ICD-10-CM | POA: Diagnosis not present

## 2013-03-23 DIAGNOSIS — L570 Actinic keratosis: Secondary | ICD-10-CM | POA: Diagnosis not present

## 2013-03-23 DIAGNOSIS — L821 Other seborrheic keratosis: Secondary | ICD-10-CM | POA: Diagnosis not present

## 2013-03-23 DIAGNOSIS — D239 Other benign neoplasm of skin, unspecified: Secondary | ICD-10-CM | POA: Diagnosis not present

## 2013-03-23 DIAGNOSIS — L723 Sebaceous cyst: Secondary | ICD-10-CM | POA: Diagnosis not present

## 2013-04-05 DIAGNOSIS — Z79899 Other long term (current) drug therapy: Secondary | ICD-10-CM | POA: Diagnosis not present

## 2013-04-05 DIAGNOSIS — Z125 Encounter for screening for malignant neoplasm of prostate: Secondary | ICD-10-CM | POA: Diagnosis not present

## 2013-04-05 DIAGNOSIS — E785 Hyperlipidemia, unspecified: Secondary | ICD-10-CM | POA: Diagnosis not present

## 2013-04-05 DIAGNOSIS — N529 Male erectile dysfunction, unspecified: Secondary | ICD-10-CM | POA: Diagnosis not present

## 2013-04-05 DIAGNOSIS — I10 Essential (primary) hypertension: Secondary | ICD-10-CM | POA: Diagnosis not present

## 2013-04-13 DIAGNOSIS — I4949 Other premature depolarization: Secondary | ICD-10-CM | POA: Diagnosis not present

## 2013-04-13 DIAGNOSIS — I10 Essential (primary) hypertension: Secondary | ICD-10-CM | POA: Diagnosis not present

## 2013-04-13 DIAGNOSIS — E785 Hyperlipidemia, unspecified: Secondary | ICD-10-CM | POA: Diagnosis not present

## 2013-04-13 DIAGNOSIS — K219 Gastro-esophageal reflux disease without esophagitis: Secondary | ICD-10-CM | POA: Diagnosis not present

## 2013-04-13 DIAGNOSIS — Z0000 Encounter for general adult medical examination without abnormal findings: Secondary | ICD-10-CM | POA: Diagnosis not present

## 2013-04-19 DIAGNOSIS — C44611 Basal cell carcinoma of skin of unspecified upper limb, including shoulder: Secondary | ICD-10-CM | POA: Diagnosis not present

## 2013-05-05 NOTE — Progress Notes (Signed)
REVIEWED.  

## 2013-07-05 DIAGNOSIS — D485 Neoplasm of uncertain behavior of skin: Secondary | ICD-10-CM | POA: Diagnosis not present

## 2013-07-06 DIAGNOSIS — L570 Actinic keratosis: Secondary | ICD-10-CM | POA: Diagnosis not present

## 2013-07-06 DIAGNOSIS — C44529 Squamous cell carcinoma of skin of other part of trunk: Secondary | ICD-10-CM | POA: Diagnosis not present

## 2013-08-08 DIAGNOSIS — C44529 Squamous cell carcinoma of skin of other part of trunk: Secondary | ICD-10-CM | POA: Diagnosis not present

## 2013-08-09 DIAGNOSIS — C44529 Squamous cell carcinoma of skin of other part of trunk: Secondary | ICD-10-CM | POA: Diagnosis not present

## 2013-10-02 DIAGNOSIS — D485 Neoplasm of uncertain behavior of skin: Secondary | ICD-10-CM | POA: Diagnosis not present

## 2013-10-02 DIAGNOSIS — Z85828 Personal history of other malignant neoplasm of skin: Secondary | ICD-10-CM | POA: Diagnosis not present

## 2013-10-02 DIAGNOSIS — L570 Actinic keratosis: Secondary | ICD-10-CM | POA: Diagnosis not present

## 2013-10-16 DIAGNOSIS — I729 Aneurysm of unspecified site: Secondary | ICD-10-CM | POA: Diagnosis not present

## 2013-10-16 DIAGNOSIS — E785 Hyperlipidemia, unspecified: Secondary | ICD-10-CM | POA: Diagnosis not present

## 2013-11-21 ENCOUNTER — Other Ambulatory Visit: Payer: Self-pay | Admitting: Dermatology

## 2013-11-21 DIAGNOSIS — D485 Neoplasm of uncertain behavior of skin: Secondary | ICD-10-CM | POA: Diagnosis not present

## 2013-11-21 DIAGNOSIS — C44319 Basal cell carcinoma of skin of other parts of face: Secondary | ICD-10-CM | POA: Diagnosis not present

## 2013-11-21 DIAGNOSIS — L821 Other seborrheic keratosis: Secondary | ICD-10-CM | POA: Diagnosis not present

## 2013-12-21 DIAGNOSIS — C44319 Basal cell carcinoma of skin of other parts of face: Secondary | ICD-10-CM | POA: Diagnosis not present

## 2014-01-22 DIAGNOSIS — Z85828 Personal history of other malignant neoplasm of skin: Secondary | ICD-10-CM | POA: Diagnosis not present

## 2014-01-22 DIAGNOSIS — L570 Actinic keratosis: Secondary | ICD-10-CM | POA: Diagnosis not present

## 2014-01-22 DIAGNOSIS — L821 Other seborrheic keratosis: Secondary | ICD-10-CM | POA: Diagnosis not present

## 2014-01-30 DIAGNOSIS — Z23 Encounter for immunization: Secondary | ICD-10-CM | POA: Diagnosis not present

## 2014-04-04 ENCOUNTER — Other Ambulatory Visit: Payer: Self-pay | Admitting: Dermatology

## 2014-04-04 DIAGNOSIS — L821 Other seborrheic keratosis: Secondary | ICD-10-CM | POA: Diagnosis not present

## 2014-04-04 DIAGNOSIS — D239 Other benign neoplasm of skin, unspecified: Secondary | ICD-10-CM | POA: Diagnosis not present

## 2014-04-04 DIAGNOSIS — L570 Actinic keratosis: Secondary | ICD-10-CM | POA: Diagnosis not present

## 2014-04-04 DIAGNOSIS — Z808 Family history of malignant neoplasm of other organs or systems: Secondary | ICD-10-CM | POA: Diagnosis not present

## 2014-04-04 DIAGNOSIS — D485 Neoplasm of uncertain behavior of skin: Secondary | ICD-10-CM | POA: Diagnosis not present

## 2014-04-04 DIAGNOSIS — Z85828 Personal history of other malignant neoplasm of skin: Secondary | ICD-10-CM | POA: Diagnosis not present

## 2014-04-04 DIAGNOSIS — C44612 Basal cell carcinoma of skin of right upper limb, including shoulder: Secondary | ICD-10-CM | POA: Diagnosis not present

## 2014-04-13 DIAGNOSIS — K219 Gastro-esophageal reflux disease without esophagitis: Secondary | ICD-10-CM | POA: Diagnosis not present

## 2014-04-13 DIAGNOSIS — Z125 Encounter for screening for malignant neoplasm of prostate: Secondary | ICD-10-CM | POA: Diagnosis not present

## 2014-04-13 DIAGNOSIS — I10 Essential (primary) hypertension: Secondary | ICD-10-CM | POA: Diagnosis not present

## 2014-04-13 DIAGNOSIS — E785 Hyperlipidemia, unspecified: Secondary | ICD-10-CM | POA: Diagnosis not present

## 2014-04-13 DIAGNOSIS — Z79899 Other long term (current) drug therapy: Secondary | ICD-10-CM | POA: Diagnosis not present

## 2014-04-13 DIAGNOSIS — C4480 Unspecified malignant neoplasm of overlapping sites of skin: Secondary | ICD-10-CM | POA: Diagnosis not present

## 2014-04-13 DIAGNOSIS — I671 Cerebral aneurysm, nonruptured: Secondary | ICD-10-CM | POA: Diagnosis not present

## 2014-04-20 DIAGNOSIS — E785 Hyperlipidemia, unspecified: Secondary | ICD-10-CM | POA: Diagnosis not present

## 2014-04-20 DIAGNOSIS — Z0001 Encounter for general adult medical examination with abnormal findings: Secondary | ICD-10-CM | POA: Diagnosis not present

## 2014-04-20 DIAGNOSIS — N390 Urinary tract infection, site not specified: Secondary | ICD-10-CM | POA: Diagnosis not present

## 2014-04-20 DIAGNOSIS — I10 Essential (primary) hypertension: Secondary | ICD-10-CM | POA: Diagnosis not present

## 2014-04-20 DIAGNOSIS — I671 Cerebral aneurysm, nonruptured: Secondary | ICD-10-CM | POA: Diagnosis not present

## 2014-05-31 DIAGNOSIS — C44612 Basal cell carcinoma of skin of right upper limb, including shoulder: Secondary | ICD-10-CM | POA: Diagnosis not present

## 2014-05-31 DIAGNOSIS — C44519 Basal cell carcinoma of skin of other part of trunk: Secondary | ICD-10-CM | POA: Diagnosis not present

## 2014-06-22 DIAGNOSIS — Z23 Encounter for immunization: Secondary | ICD-10-CM | POA: Diagnosis not present

## 2014-07-16 DIAGNOSIS — Z125 Encounter for screening for malignant neoplasm of prostate: Secondary | ICD-10-CM | POA: Diagnosis not present

## 2014-07-23 DIAGNOSIS — I10 Essential (primary) hypertension: Secondary | ICD-10-CM | POA: Diagnosis not present

## 2014-07-23 DIAGNOSIS — R972 Elevated prostate specific antigen [PSA]: Secondary | ICD-10-CM | POA: Diagnosis not present

## 2014-09-19 DIAGNOSIS — J069 Acute upper respiratory infection, unspecified: Secondary | ICD-10-CM | POA: Diagnosis not present

## 2014-09-19 DIAGNOSIS — J309 Allergic rhinitis, unspecified: Secondary | ICD-10-CM | POA: Diagnosis not present

## 2014-10-04 DIAGNOSIS — L570 Actinic keratosis: Secondary | ICD-10-CM | POA: Diagnosis not present

## 2014-10-04 DIAGNOSIS — Z85828 Personal history of other malignant neoplasm of skin: Secondary | ICD-10-CM | POA: Diagnosis not present

## 2014-10-04 DIAGNOSIS — C44519 Basal cell carcinoma of skin of other part of trunk: Secondary | ICD-10-CM | POA: Diagnosis not present

## 2014-10-04 DIAGNOSIS — C44612 Basal cell carcinoma of skin of right upper limb, including shoulder: Secondary | ICD-10-CM | POA: Diagnosis not present

## 2014-10-26 ENCOUNTER — Ambulatory Visit (INDEPENDENT_AMBULATORY_CARE_PROVIDER_SITE_OTHER): Payer: MEDICARE | Admitting: Podiatry

## 2014-10-26 ENCOUNTER — Ambulatory Visit: Payer: MEDICARE

## 2014-10-26 DIAGNOSIS — M722 Plantar fascial fibromatosis: Secondary | ICD-10-CM | POA: Diagnosis not present

## 2014-10-26 MED ORDER — TRIAMCINOLONE ACETONIDE 10 MG/ML IJ SUSP
10.0000 mg | Freq: Once | INTRAMUSCULAR | Status: AC
  Administered 2014-10-26: 10 mg

## 2014-10-26 NOTE — Progress Notes (Signed)
   Subjective:    Patient ID: [name] [name], male    DOB: 07-Nov-1945, 69 y.o.   MRN: [id number]  HPI Pt presents with left heel pain from previous injury one month ago, states he hit his heel on a curb while on a cruise, the area bruised and has not had any pain relief since. He has tried otc orthotics without relief   Review of Systems  All other systems reviewed and are negative.      Objective:   Physical Exam        Assessment & Plan:

## 2014-10-26 NOTE — Patient Instructions (Signed)

## 2014-10-27 NOTE — Progress Notes (Signed)
Subjective:     Patient ID: [name] [name], male   DOB: [birth year], 69 y.o.   MRN: [id number]  HPI patient states his left heel has been hurting him for about a month after injuring it when he fell off a curb. States it's been getting gradually more painful with ambulation and when getting up in the morning   Review of Systems  All other systems reviewed and are negative.      Objective:   Physical Exam  Constitutional: He is oriented to person, place, and time.  Cardiovascular: Intact distal pulses.   Musculoskeletal: Normal range of motion.  Neurological: He is oriented to person, place, and time.  Skin: Skin is warm.  Nursing note and vitals reviewed.  neurovascular status intact muscle strength adequate range of motion within normal limits. Patient's noted to have quite a bit of discomfort left plantar heel at the insertional point tendon into the calcaneus with inflammation and fluid around the medial band area the heel bone itself is not sore currently and patient's noted to have good digital perfusion     Assessment:     Plantar fasciitis acute nature left with injury as part of the history    Plan:     H&P and condition reviewed with patient. Today I injected the plantar fascia 3 mg Kenalog 5 mg Xylocaine and applied fascial brace with instructions on usage. Patient will be seen back for [place] to recheck again be reevaluated

## 2014-11-27 DIAGNOSIS — I10 Essential (primary) hypertension: Secondary | ICD-10-CM | POA: Diagnosis not present

## 2014-11-27 DIAGNOSIS — Z6827 Body mass index (BMI) 27.0-27.9, adult: Secondary | ICD-10-CM | POA: Diagnosis not present

## 2015-01-21 DIAGNOSIS — R05 Cough: Secondary | ICD-10-CM | POA: Diagnosis not present

## 2015-01-21 DIAGNOSIS — Z6827 Body mass index (BMI) 27.0-27.9, adult: Secondary | ICD-10-CM | POA: Diagnosis not present

## 2015-02-09 DIAGNOSIS — Z23 Encounter for immunization: Secondary | ICD-10-CM | POA: Diagnosis not present

## 2015-04-24 DIAGNOSIS — Z79899 Other long term (current) drug therapy: Secondary | ICD-10-CM | POA: Diagnosis not present

## 2015-04-24 DIAGNOSIS — L570 Actinic keratosis: Secondary | ICD-10-CM | POA: Diagnosis not present

## 2015-04-24 DIAGNOSIS — D225 Melanocytic nevi of trunk: Secondary | ICD-10-CM | POA: Diagnosis not present

## 2015-04-24 DIAGNOSIS — I671 Cerebral aneurysm, nonruptured: Secondary | ICD-10-CM | POA: Diagnosis not present

## 2015-04-24 DIAGNOSIS — I10 Essential (primary) hypertension: Secondary | ICD-10-CM | POA: Diagnosis not present

## 2015-04-24 DIAGNOSIS — Z125 Encounter for screening for malignant neoplasm of prostate: Secondary | ICD-10-CM | POA: Diagnosis not present

## 2015-04-24 DIAGNOSIS — L821 Other seborrheic keratosis: Secondary | ICD-10-CM | POA: Diagnosis not present

## 2015-04-24 DIAGNOSIS — Z85828 Personal history of other malignant neoplasm of skin: Secondary | ICD-10-CM | POA: Diagnosis not present

## 2015-04-24 DIAGNOSIS — C44619 Basal cell carcinoma of skin of left upper limb, including shoulder: Secondary | ICD-10-CM | POA: Diagnosis not present

## 2015-04-24 DIAGNOSIS — D485 Neoplasm of uncertain behavior of skin: Secondary | ICD-10-CM | POA: Diagnosis not present

## 2015-05-02 DIAGNOSIS — Z6827 Body mass index (BMI) 27.0-27.9, adult: Secondary | ICD-10-CM | POA: Diagnosis not present

## 2015-05-02 DIAGNOSIS — I10 Essential (primary) hypertension: Secondary | ICD-10-CM | POA: Diagnosis not present

## 2015-05-02 DIAGNOSIS — E785 Hyperlipidemia, unspecified: Secondary | ICD-10-CM | POA: Diagnosis not present

## 2015-05-02 DIAGNOSIS — K219 Gastro-esophageal reflux disease without esophagitis: Secondary | ICD-10-CM | POA: Diagnosis not present

## 2015-05-02 DIAGNOSIS — I671 Cerebral aneurysm, nonruptured: Secondary | ICD-10-CM | POA: Diagnosis not present

## 2015-05-02 DIAGNOSIS — Z0001 Encounter for general adult medical examination with abnormal findings: Secondary | ICD-10-CM | POA: Diagnosis not present

## 2015-05-22 DIAGNOSIS — C44619 Basal cell carcinoma of skin of left upper limb, including shoulder: Secondary | ICD-10-CM | POA: Diagnosis not present

## 2015-05-22 DIAGNOSIS — L570 Actinic keratosis: Secondary | ICD-10-CM | POA: Diagnosis not present

## 2015-10-15 DIAGNOSIS — D485 Neoplasm of uncertain behavior of skin: Secondary | ICD-10-CM | POA: Diagnosis not present

## 2015-10-15 DIAGNOSIS — C44219 Basal cell carcinoma of skin of left ear and external auricular canal: Secondary | ICD-10-CM | POA: Diagnosis not present

## 2015-10-15 DIAGNOSIS — L570 Actinic keratosis: Secondary | ICD-10-CM | POA: Diagnosis not present

## 2015-11-01 DIAGNOSIS — I671 Cerebral aneurysm, nonruptured: Secondary | ICD-10-CM | POA: Diagnosis not present

## 2015-11-01 DIAGNOSIS — I10 Essential (primary) hypertension: Secondary | ICD-10-CM | POA: Diagnosis not present

## 2015-11-28 DIAGNOSIS — L570 Actinic keratosis: Secondary | ICD-10-CM | POA: Diagnosis not present

## 2015-12-31 DIAGNOSIS — C44219 Basal cell carcinoma of skin of left ear and external auricular canal: Secondary | ICD-10-CM | POA: Diagnosis not present

## 2016-02-05 DIAGNOSIS — Z23 Encounter for immunization: Secondary | ICD-10-CM | POA: Diagnosis not present

## 2016-03-24 DIAGNOSIS — L570 Actinic keratosis: Secondary | ICD-10-CM | POA: Diagnosis not present

## 2016-03-24 DIAGNOSIS — Z808 Family history of malignant neoplasm of other organs or systems: Secondary | ICD-10-CM | POA: Diagnosis not present

## 2016-03-24 DIAGNOSIS — L814 Other melanin hyperpigmentation: Secondary | ICD-10-CM | POA: Diagnosis not present

## 2016-03-24 DIAGNOSIS — D225 Melanocytic nevi of trunk: Secondary | ICD-10-CM | POA: Diagnosis not present

## 2016-03-24 DIAGNOSIS — L821 Other seborrheic keratosis: Secondary | ICD-10-CM | POA: Diagnosis not present

## 2016-03-24 DIAGNOSIS — D1801 Hemangioma of skin and subcutaneous tissue: Secondary | ICD-10-CM | POA: Diagnosis not present

## 2016-05-15 DIAGNOSIS — Z125 Encounter for screening for malignant neoplasm of prostate: Secondary | ICD-10-CM | POA: Diagnosis not present

## 2016-05-15 DIAGNOSIS — E785 Hyperlipidemia, unspecified: Secondary | ICD-10-CM | POA: Diagnosis not present

## 2016-05-15 DIAGNOSIS — I10 Essential (primary) hypertension: Secondary | ICD-10-CM | POA: Diagnosis not present

## 2016-05-15 DIAGNOSIS — K219 Gastro-esophageal reflux disease without esophagitis: Secondary | ICD-10-CM | POA: Diagnosis not present

## 2016-05-15 DIAGNOSIS — Z79899 Other long term (current) drug therapy: Secondary | ICD-10-CM | POA: Diagnosis not present

## 2016-05-22 DIAGNOSIS — K219 Gastro-esophageal reflux disease without esophagitis: Secondary | ICD-10-CM | POA: Diagnosis not present

## 2016-05-22 DIAGNOSIS — E785 Hyperlipidemia, unspecified: Secondary | ICD-10-CM | POA: Diagnosis not present

## 2016-05-22 DIAGNOSIS — Z6826 Body mass index (BMI) 26.0-26.9, adult: Secondary | ICD-10-CM | POA: Diagnosis not present

## 2016-05-22 DIAGNOSIS — I4949 Other premature depolarization: Secondary | ICD-10-CM | POA: Diagnosis not present

## 2016-05-22 DIAGNOSIS — I10 Essential (primary) hypertension: Secondary | ICD-10-CM | POA: Diagnosis not present

## 2016-07-16 DIAGNOSIS — L570 Actinic keratosis: Secondary | ICD-10-CM | POA: Diagnosis not present

## 2016-08-20 DIAGNOSIS — L570 Actinic keratosis: Secondary | ICD-10-CM | POA: Diagnosis not present

## 2016-11-20 DIAGNOSIS — I10 Essential (primary) hypertension: Secondary | ICD-10-CM | POA: Diagnosis not present

## 2016-11-20 DIAGNOSIS — K219 Gastro-esophageal reflux disease without esophagitis: Secondary | ICD-10-CM | POA: Diagnosis not present

## 2016-12-28 DIAGNOSIS — R5383 Other fatigue: Secondary | ICD-10-CM | POA: Diagnosis not present

## 2017-01-27 DIAGNOSIS — J209 Acute bronchitis, unspecified: Secondary | ICD-10-CM | POA: Diagnosis not present

## 2017-01-27 DIAGNOSIS — J069 Acute upper respiratory infection, unspecified: Secondary | ICD-10-CM | POA: Diagnosis not present

## 2017-02-06 DIAGNOSIS — Z23 Encounter for immunization: Secondary | ICD-10-CM | POA: Diagnosis not present

## 2017-02-10 DIAGNOSIS — J209 Acute bronchitis, unspecified: Secondary | ICD-10-CM | POA: Diagnosis not present

## 2017-02-15 DIAGNOSIS — E785 Hyperlipidemia, unspecified: Secondary | ICD-10-CM | POA: Diagnosis not present

## 2017-02-15 DIAGNOSIS — K8044 Calculus of bile duct with chronic cholecystitis without obstruction: Secondary | ICD-10-CM | POA: Diagnosis not present

## 2017-02-15 DIAGNOSIS — Z87891 Personal history of nicotine dependence: Secondary | ICD-10-CM | POA: Diagnosis not present

## 2017-02-15 DIAGNOSIS — I10 Essential (primary) hypertension: Secondary | ICD-10-CM | POA: Diagnosis not present

## 2017-02-15 DIAGNOSIS — R109 Unspecified abdominal pain: Secondary | ICD-10-CM | POA: Diagnosis not present

## 2017-02-15 DIAGNOSIS — K851 Biliary acute pancreatitis without necrosis or infection: Secondary | ICD-10-CM | POA: Diagnosis not present

## 2017-02-15 DIAGNOSIS — K219 Gastro-esophageal reflux disease without esophagitis: Secondary | ICD-10-CM | POA: Diagnosis not present

## 2017-02-16 DIAGNOSIS — K219 Gastro-esophageal reflux disease without esophagitis: Secondary | ICD-10-CM | POA: Diagnosis present

## 2017-02-16 DIAGNOSIS — Z87891 Personal history of nicotine dependence: Secondary | ICD-10-CM | POA: Diagnosis not present

## 2017-02-16 DIAGNOSIS — K838 Other specified diseases of biliary tract: Secondary | ICD-10-CM | POA: Diagnosis not present

## 2017-02-16 DIAGNOSIS — R109 Unspecified abdominal pain: Secondary | ICD-10-CM | POA: Diagnosis not present

## 2017-02-16 DIAGNOSIS — N179 Acute kidney failure, unspecified: Secondary | ICD-10-CM | POA: Diagnosis not present

## 2017-02-16 DIAGNOSIS — E785 Hyperlipidemia, unspecified: Secondary | ICD-10-CM | POA: Diagnosis present

## 2017-02-16 DIAGNOSIS — I10 Essential (primary) hypertension: Secondary | ICD-10-CM | POA: Diagnosis not present

## 2017-02-16 DIAGNOSIS — K8044 Calculus of bile duct with chronic cholecystitis without obstruction: Secondary | ICD-10-CM | POA: Diagnosis present

## 2017-02-16 DIAGNOSIS — K8050 Calculus of bile duct without cholangitis or cholecystitis without obstruction: Secondary | ICD-10-CM | POA: Diagnosis not present

## 2017-02-16 DIAGNOSIS — K851 Biliary acute pancreatitis without necrosis or infection: Secondary | ICD-10-CM | POA: Diagnosis not present

## 2017-02-16 DIAGNOSIS — E876 Hypokalemia: Secondary | ICD-10-CM | POA: Diagnosis present

## 2017-02-16 DIAGNOSIS — R5081 Fever presenting with conditions classified elsewhere: Secondary | ICD-10-CM | POA: Diagnosis not present

## 2017-02-16 DIAGNOSIS — K811 Chronic cholecystitis: Secondary | ICD-10-CM | POA: Diagnosis not present

## 2017-02-16 DIAGNOSIS — K759 Inflammatory liver disease, unspecified: Secondary | ICD-10-CM | POA: Diagnosis present

## 2017-02-16 DIAGNOSIS — B179 Acute viral hepatitis, unspecified: Secondary | ICD-10-CM | POA: Diagnosis not present

## 2017-02-16 DIAGNOSIS — K8689 Other specified diseases of pancreas: Secondary | ICD-10-CM | POA: Diagnosis not present

## 2017-02-16 DIAGNOSIS — K8070 Calculus of gallbladder and bile duct without cholecystitis without obstruction: Secondary | ICD-10-CM | POA: Diagnosis not present

## 2017-03-02 ENCOUNTER — Encounter: Payer: Self-pay | Admitting: Internal Medicine

## 2017-03-02 DIAGNOSIS — Z9049 Acquired absence of other specified parts of digestive tract: Secondary | ICD-10-CM | POA: Diagnosis not present

## 2017-03-09 ENCOUNTER — Ambulatory Visit (INDEPENDENT_AMBULATORY_CARE_PROVIDER_SITE_OTHER): Payer: MEDICARE | Admitting: Internal Medicine

## 2017-03-09 ENCOUNTER — Encounter: Payer: Self-pay | Admitting: Internal Medicine

## 2017-03-09 VITALS — BP 109/74 | HR 71 | Temp 97.1°F | Ht 73.0 in | Wt 185.8 lb

## 2017-03-09 DIAGNOSIS — K8050 Calculus of bile duct without cholangitis or cholecystitis without obstruction: Secondary | ICD-10-CM

## 2017-03-09 NOTE — Patient Instructions (Addendum)
Schedule ERCP with stent removal (hx of common duct stone and suppurative cholangitis)   Unasyn 1.5 mg IV on call for ERCP  Early am appointment  (week of November 12th)  Further recommendations to follow

## 2017-03-09 NOTE — Progress Notes (Signed)
Primary Care Physician:  Asencion Noble, MD Primary Gastroenterologist:  Dr. Gala Romney  Pre-Procedure History & Physical: HPI:  [name] [name] is a 71 y.o. male here for for follow-up recent bout of suppurative cholangitis/cholecystitis requiring an ERCP with stone extraction stent placement and cholecystectomy down at [facility]. about 3 weeks ago. Patient was airlifted off a cruise ship with severe abdominal pain and jaundice. Ultimately, it is reported he had common duct stones with pus in the duct. Stones were extracted - stent was left in place. A week later he got his gallbladder out and then came back to [place].  He reports doing just fine at this time. Not having any abdominal pain, whatsoever. I do have records from the [facility] admission but I do not have the actual ERCP report. In the accompanying records, it is noted that the stones were removed and a stent was placed. I am not certain whether all stones were removed or not. A stent was left in place. Apparently, he had a great deal of inflammation within his biliary tree.  Past Medical History:  Diagnosis Date  . Frequent headaches   . GERD (gastroesophageal reflux disease)   . Hyperlipidemia   . Hypertension   . Skin cancer    Squamous, basal-multiple    Past Surgical History:  Procedure Laterality Date  . APPENDECTOMY  1964  . CEREBRAL ANEURYSM REPAIR     age 68  . COLONOSCOPY  06/28/2007   KDT:OIZTIW rectum, left-sided diverticula.  Remainder of colonic mucosa appeared normal, suboptimal prep of ascending segment made exam more difficult in this area.    Prior to Admission medications   Medication Sig Start Date End Date Taking? Authorizing Provider  aspirin EC 81 MG tablet Take 81 mg daily by mouth.   Yes [provider]  CIALIS 20 MG tablet Take 20 mg by mouth daily as needed.  10/11/12  Yes [provider]  Coenzyme Q10 (CO Q 10 PO) Take 1 tablet by mouth daily.   Yes [provider]    losartan-hydrochlorothiazide (HYZAAR) 100-25 MG per tablet Take 1 tablet by mouth daily.  09/12/12  Yes [provider]  lovastatin (MEVACOR) 20 MG tablet Take 20 mg by mouth at bedtime.  10/17/12  Yes [provider]  Multiple Vitamin (MULTIVITAMIN WITH MINERALS) TABS Take 1 tablet by mouth daily.   Yes [provider]  omeprazole (PRILOSEC) 20 MG capsule Take 20 mg by mouth daily.  09/10/12  Yes [provider]  aspirin 325 MG tablet Take 325 mg by mouth daily.    [provider]  Cyanocobalamin (VITAMIN B 12 PO) Take 1 tablet by mouth daily.    [provider]  HYDROcodone-acetaminophen (NORCO/VICODIN) 5-325 MG per tablet Take 1 tablet by mouth 2 (two) times daily as needed for pain.  10/19/12   [provider]  NON FORMULARY Saw Palmetto 450 mg    [provider]  polyethylene glycol-electrolytes (TRILYTE) 420 G solution Take 4,000 mLs by mouth as directed. Patient not taking: Reported on 03/10/2017 10/31/12   Daneil Dolin, MD  vitamin E 400 UNIT capsule Take 400 Units by mouth daily.    [provider]    Allergies as of 03/09/2017  . (No Known Allergies)    Family History  Problem Relation Age of Onset  . Colon cancer Mother        45    Social History   Socioeconomic History  . Marital status:  Married    Spouse name: Not on file  . Number of children: 3  . Years of education: Not on file  . Highest education level: Not on file  Social Needs  . Financial resource strain: Not on file  . Food insecurity - worry: Not on file  . Food insecurity - inability: Not on file  . Transportation needs - medical: Not on file  . Transportation needs - non-medical: Not on file  Occupational History  . Occupation: retired  Tobacco Use  . Smoking status: Never Smoker  . Smokeless tobacco: Never Used  Substance and Sexual Activity  . Alcohol use: No  . Drug use: No  . Sexual activity: Not on file  Other  Topics Concern  . Not on file  Social History Narrative  . Not on file    Review of Systems: See HPI, otherwise negative ROS  Physical Exam: BP 109/74   Pulse 71   Temp (!) 97.1 F (36.2 C) (Oral)   Ht 6\' 1"  (1.854 m)   Wt 185 lb 12.8 oz (84.3 kg)   BMI 24.51 kg/m  General:   Alert,  Well-developed, well-nourished, pleasant and cooperative in NAD Neck:  Supple; no masses or thyromegaly. No significant cervical adenopathy. Lungs:  Clear throughout to auscultation.   No wheezes, crackles, or rhonchi. No acute distress. Heart:  Regular rate and rhythm; no murmurs, clicks, rubs,  or gallops. Abdomen: Laparoscopy port sites healing well. Non-distended, normal bowel sounds.  Soft and nontender without appreciable mass or hepatosplenomegaly.  Pulses:  Normal pulses noted. Extremities:  Without clubbing or edema.  Impression:  Pleasant 71 year old gentleman who unfortunately developed cholangitis cholecystitis while on his cruise vacation. Recent emergency ERCP and subsequent cholecystectomy performed with resolution of his symptoms. He has an indwelling stent that does need to be removed in the next couple of weeks.  I would very much like to review the actual ERCP report prior to proceeding with stent removal.  Recommendations:   We'll plan for ERCP with stent removal/clearance of duct in about 2 weeks from now. The risks, benefits, limitations, alternatives, and mponderable have been reviewed with the patient. I specifically discussed a1 in 10 chance of pancreatitis, reaction to medications, bleeding, perforation and the possibility of a failed ERCP.  Questions have been answered. All parties agreeable.        Notice: This dictation was prepared with Dragon dictation along with smaller phrase technology. Any transcriptional errors that result from this process are unintentional and may not be corrected upon review.

## 2017-03-10 ENCOUNTER — Other Ambulatory Visit: Payer: Self-pay

## 2017-03-10 ENCOUNTER — Telehealth: Payer: Self-pay

## 2017-03-10 DIAGNOSIS — K8309 Other cholangitis: Secondary | ICD-10-CM

## 2017-03-10 DIAGNOSIS — K8050 Calculus of bile duct without cholangitis or cholecystitis without obstruction: Secondary | ICD-10-CM

## 2017-03-10 NOTE — Telephone Encounter (Signed)
Aspirin is okay.

## 2017-03-10 NOTE — Telephone Encounter (Signed)
Pt called office. ERCP scheduled for 03/16/17 at 12:30pm. Pre-op appt 03/12/17 at 1:15pm-pt aware. Pt informed to be NPO after midnight before procedure. Will mail instructions.  Dr. Gala Romney, pt takes Aspirin 81mg  po daily. Please advise.

## 2017-03-10 NOTE — Telephone Encounter (Signed)
Tried to call pt to schedule ERCP. No answer, LMOAM for return call.

## 2017-03-10 NOTE — Telephone Encounter (Signed)
Called and informed pt, ok to continue Aspirin. Instructions for procedure mailed. Instructions given to pt on phone.

## 2017-03-10 NOTE — Telephone Encounter (Signed)
Dr. Gala Romney pt brought in an updated medication list

## 2017-03-11 NOTE — Patient Instructions (Signed)
    [name] [name]  03/11/2017     @PREFPERIOPPHARMACY @   Your procedure is scheduled on 03/16/2017.  Report to [facility] at 12:15 P.M.  Call this number if you have problems the morning of surgery:  [phone]   Remember:  Do not eat food or drink liquids after midnight.  Take these medicines the morning of surgery with A SIP OF WATER Prilosec   Do not wear jewelry, make-up or nail polish.  Do not wear lotions, powders, or perfumes, or deoderant.  Do not shave 48 hours prior to surgery.  Men may shave face and neck.  Do not bring valuables to the hospital.  [facility] is not responsible for any belongings or valuables.  Contacts, dentures or bridgework may not be worn into surgery.  Leave your suitcase in the car.  After surgery it may be brought to your room.  For patients admitted to the hospital, discharge time will be determined by your treatment team.  Patients discharged the day of surgery will not be allowed to drive home.    Please read over the following fact sheets that you were given. Surgical Site Infection Prevention and Anesthesia Post-op Instructions     PATIENT INSTRUCTIONS POST-ANESTHESIA  IMMEDIATELY FOLLOWING SURGERY:  Do not drive or operate machinery for the first twenty four hours after surgery.  Do not make any important decisions for twenty four hours after surgery or while taking narcotic pain medications or sedatives.  If you develop intractable nausea and vomiting or a severe headache please notify your doctor immediately.  FOLLOW-UP:  Please make an appointment with your surgeon as instructed. You do not need to follow up with anesthesia unless specifically instructed to do so.  WOUND CARE INSTRUCTIONS (if applicable):  Keep a dry clean dressing on the anesthesia/puncture wound site if there is drainage.  Once the wound has quit draining you may leave it open to air.  Generally you should leave the bandage intact for twenty four hours unless  there is drainage.  If the epidural site drains for more than 36-48 hours please call the anesthesia department.  QUESTIONS?:  Please feel free to call your physician or the hospital operator if you have any questions, and they will be happy to assist you.

## 2017-03-12 ENCOUNTER — Encounter (HOSPITAL_COMMUNITY): Payer: Self-pay

## 2017-03-12 ENCOUNTER — Encounter (HOSPITAL_COMMUNITY)
Admission: RE | Admit: 2017-03-12 | Discharge: 2017-03-12 | Disposition: A | Discharge status: Home / Self Care | Payer: MEDICARE | Source: Ambulatory Visit | Attending: Internal Medicine | Admitting: Internal Medicine

## 2017-03-12 ENCOUNTER — Other Ambulatory Visit: Payer: Self-pay

## 2017-03-12 DIAGNOSIS — Z01812 Encounter for preprocedural laboratory examination: Secondary | ICD-10-CM | POA: Insufficient documentation

## 2017-03-12 DIAGNOSIS — Z01810 Encounter for preprocedural cardiovascular examination: Secondary | ICD-10-CM | POA: Diagnosis not present

## 2017-03-12 DIAGNOSIS — K219 Gastro-esophageal reflux disease without esophagitis: Secondary | ICD-10-CM | POA: Insufficient documentation

## 2017-03-12 DIAGNOSIS — I10 Essential (primary) hypertension: Secondary | ICD-10-CM | POA: Insufficient documentation

## 2017-03-12 LAB — BASIC METABOLIC PANEL
ANION GAP: 9 (ref 5–15)
BUN: 14 mg/dL (ref 6–20)
CHLORIDE: 108 mmol/L (ref 101–111)
CO2: 24 mmol/L (ref 22–32)
CREATININE: 0.76 mg/dL (ref 0.61–1.24)
Calcium: 9.5 mg/dL (ref 8.9–10.3)
GFR calc non Af Amer: >60 mL/min (ref >60)
Glucose, Bld: 75 mg/dL (ref 65–99)
POTASSIUM: 3.3 mmol/L — AB (ref 3.5–5.1)
SODIUM: 141 mmol/L (ref 135–145)

## 2017-03-12 LAB — CBC
HEMATOCRIT: 41.4 % (ref 39.0–52.0)
HEMOGLOBIN: 15.1 g/dL (ref 13.0–17.0)
MCH: 30.4 pg (ref 26.0–34.0)
MCHC: 36.5 g/dL — ABNORMAL HIGH (ref 30.0–36.0)
MCV: 83.5 fL (ref 78.0–100.0)
Platelets: 199 10*3/uL (ref 150–400)
RBC: 4.96 MIL/uL (ref 4.22–5.81)
RDW: 13.4 % (ref 11.5–15.5)
WBC: 8.7 10*3/uL (ref 4.0–10.5)

## 2017-03-16 ENCOUNTER — Encounter (HOSPITAL_COMMUNITY): Payer: Self-pay | Admitting: *Deleted

## 2017-03-16 ENCOUNTER — Encounter (HOSPITAL_COMMUNITY)
Admission: RE | Disposition: A | Discharge status: Home / Self Care | Payer: Self-pay | Source: Ambulatory Visit | Attending: Internal Medicine

## 2017-03-16 ENCOUNTER — Ambulatory Visit (HOSPITAL_COMMUNITY): Payer: MEDICARE | Admitting: Anesthesiology

## 2017-03-16 ENCOUNTER — Ambulatory Visit (HOSPITAL_COMMUNITY)
Admission: RE | Admit: 2017-03-16 | Discharge: 2017-03-16 | Disposition: A | Discharge status: Home / Self Care | Payer: MEDICARE | Source: Ambulatory Visit | Attending: Internal Medicine | Admitting: Internal Medicine

## 2017-03-16 ENCOUNTER — Ambulatory Visit (HOSPITAL_COMMUNITY): Payer: MEDICARE

## 2017-03-16 DIAGNOSIS — K219 Gastro-esophageal reflux disease without esophagitis: Secondary | ICD-10-CM | POA: Diagnosis not present

## 2017-03-16 DIAGNOSIS — E7800 Pure hypercholesterolemia, unspecified: Secondary | ICD-10-CM | POA: Insufficient documentation

## 2017-03-16 DIAGNOSIS — K8050 Calculus of bile duct without cholangitis or cholecystitis without obstruction: Secondary | ICD-10-CM

## 2017-03-16 DIAGNOSIS — K8309 Other cholangitis: Secondary | ICD-10-CM

## 2017-03-16 DIAGNOSIS — Z87891 Personal history of nicotine dependence: Secondary | ICD-10-CM | POA: Insufficient documentation

## 2017-03-16 DIAGNOSIS — Z79899 Other long term (current) drug therapy: Secondary | ICD-10-CM | POA: Insufficient documentation

## 2017-03-16 DIAGNOSIS — I10 Essential (primary) hypertension: Secondary | ICD-10-CM | POA: Insufficient documentation

## 2017-03-16 DIAGNOSIS — Z4659 Encounter for fitting and adjustment of other gastrointestinal appliance and device: Secondary | ICD-10-CM | POA: Diagnosis not present

## 2017-03-16 DIAGNOSIS — Z8719 Personal history of other diseases of the digestive system: Secondary | ICD-10-CM | POA: Insufficient documentation

## 2017-03-16 DIAGNOSIS — E785 Hyperlipidemia, unspecified: Secondary | ICD-10-CM | POA: Insufficient documentation

## 2017-03-16 DIAGNOSIS — K8030 Calculus of bile duct with cholangitis, unspecified, without obstruction: Secondary | ICD-10-CM | POA: Diagnosis not present

## 2017-03-16 HISTORY — PX: ERCP: SHX5425

## 2017-03-16 HISTORY — PX: GASTROINTESTINAL STENT REMOVAL: SHX6384

## 2017-03-16 SURGERY — ERCP, WITH INTERVENTION IF INDICATED
Anesthesia: General

## 2017-03-16 MED ORDER — FENTANYL CITRATE (PF) 250 MCG/5ML IJ SOLN
INTRAMUSCULAR | Status: AC
  Filled 2017-03-16: qty 5

## 2017-03-16 MED ORDER — SODIUM CHLORIDE 0.9 % IV SOLN
1.5000 g | Freq: Once | INTRAVENOUS | Status: AC
  Administered 2017-03-16: 1.5 g via INTRAVENOUS
  Filled 2017-03-16: qty 1.5

## 2017-03-16 MED ORDER — INDOMETHACIN 50 MG RE SUPP
100.0000 mg | Freq: Once | RECTAL | Status: AC
  Administered 2017-03-16: 100 mg via RECTAL
  Filled 2017-03-16: qty 2

## 2017-03-16 MED ORDER — PROPOFOL 10 MG/ML IV BOLUS
INTRAVENOUS | Status: DC | PRN
  Administered 2017-03-16: 140 mg via INTRAVENOUS

## 2017-03-16 MED ORDER — GLYCOPYRROLATE 0.2 MG/ML IJ SOLN
INTRAMUSCULAR | Status: AC
  Filled 2017-03-16: qty 1

## 2017-03-16 MED ORDER — GLUCAGON HCL RDNA (DIAGNOSTIC) 1 MG IJ SOLR
INTRAMUSCULAR | Status: AC
  Filled 2017-03-16: qty 1

## 2017-03-16 MED ORDER — IOPAMIDOL (ISOVUE-300) INJECTION 61%
INTRAVENOUS | Status: AC
  Filled 2017-03-16: qty 50

## 2017-03-16 MED ORDER — AMPICILLIN-SULBACTAM SODIUM 1.5 (1-0.5) G IJ SOLR
1.5000 g | Freq: Once | INTRAMUSCULAR | Status: DC
  Filled 2017-03-16: qty 1.5

## 2017-03-16 MED ORDER — MIDAZOLAM HCL 2 MG/2ML IJ SOLN
1.0000 mg | INTRAMUSCULAR | Status: AC
  Administered 2017-03-16 (×2): 2 mg via INTRAVENOUS
  Filled 2017-03-16: qty 2

## 2017-03-16 MED ORDER — CHLORHEXIDINE GLUCONATE CLOTH 2 % EX PADS: 6.0000 | MEDICATED_PAD | Freq: Once | CUTANEOUS | Status: DC

## 2017-03-16 MED ORDER — MIDAZOLAM HCL 2 MG/2ML IJ SOLN
INTRAMUSCULAR | Status: AC
  Filled 2017-03-16: qty 2

## 2017-03-16 MED ORDER — SODIUM CHLORIDE 0.9 % IV SOLN
INTRAVENOUS | Status: AC
  Filled 2017-03-16: qty 100

## 2017-03-16 MED ORDER — PROPOFOL 10 MG/ML IV BOLUS: INTRAVENOUS | Status: DC | PRN

## 2017-03-16 MED ORDER — SUCCINYLCHOLINE 20MG/ML (10ML) SYRINGE FOR MEDFUSION PUMP - OPTIME
INTRAMUSCULAR | Status: DC | PRN
  Administered 2017-03-16: 120 mg via INTRAVENOUS

## 2017-03-16 MED ORDER — IOPAMIDOL (ISOVUE-300) INJECTION 61%
INTRAVENOUS | Status: AC
  Filled 2017-03-16: qty 100

## 2017-03-16 MED ORDER — LACTATED RINGERS IV SOLN
INTRAVENOUS | Status: DC
  Administered 2017-03-16: 13:00:00 via INTRAVENOUS

## 2017-03-16 MED ORDER — ONDANSETRON HCL 4 MG/2ML IJ SOLN
INTRAMUSCULAR | Status: AC
  Filled 2017-03-16: qty 2

## 2017-03-16 MED ORDER — LIDOCAINE HCL (CARDIAC) 10 MG/ML IV SOLN
INTRAVENOUS | Status: DC | PRN
  Administered 2017-03-16: 40 mg via INTRAVENOUS

## 2017-03-16 MED ORDER — GLYCOPYRROLATE 0.2 MG/ML IJ SOLN
0.2000 mg | Freq: Once | INTRAMUSCULAR | Status: AC
  Administered 2017-03-16: 0.2 mg via INTRAVENOUS

## 2017-03-16 MED ORDER — PROPOFOL 10 MG/ML IV BOLUS
INTRAVENOUS | Status: AC
  Filled 2017-03-16: qty 20

## 2017-03-16 MED ORDER — GLUCAGON HCL RDNA (DIAGNOSTIC) 1 MG IJ SOLR
INTRAMUSCULAR | Status: AC
  Filled 2017-03-16: qty 2

## 2017-03-16 MED ORDER — STERILE WATER FOR IRRIGATION IR SOLN
Status: DC | PRN
  Administered 2017-03-16: 1000 mL

## 2017-03-16 MED ORDER — IOPAMIDOL (ISOVUE-300) INJECTION 61%
INTRAVENOUS | Status: DC | PRN
  Administered 2017-03-16: 8 mL

## 2017-03-16 MED ORDER — ONDANSETRON HCL 4 MG/2ML IJ SOLN
4.0000 mg | Freq: Once | INTRAMUSCULAR | Status: AC
  Administered 2017-03-16: 4 mg via INTRAVENOUS

## 2017-03-16 MED ORDER — FENTANYL CITRATE (PF) 100 MCG/2ML IJ SOLN
INTRAMUSCULAR | Status: DC | PRN
  Administered 2017-03-16 (×2): 50 ug via INTRAVENOUS

## 2017-03-16 MED ORDER — ROCURONIUM 10MG/ML (10ML) SYRINGE FOR MEDFUSION PUMP - OPTIME
INTRAVENOUS | Status: DC | PRN
  Administered 2017-03-16: 5 mg via INTRAVENOUS

## 2017-03-16 NOTE — Transfer of Care (Signed)
Immediate Anesthesia Transfer of Care Note  Patient: ISEAH PLOUFF  Procedure(s) Performed: ENDOSCOPIC RETROGRADE CHOLANGIOPANCREATOGRAPHY (ERCP) (N/A ) GASTROINTESTINAL STENT REMOVAL (N/A )  Patient Location: PACU  Anesthesia Type:General  Level of Consciousness: awake and alert   Airway & Oxygen Therapy: Patient Spontanous Breathing and Patient connected to face mask oxygen  Post-op Assessment: Report given to RN  Post vital signs: Reviewed and stable  Last Vitals:  Vitals:   03/16/17 1305 03/16/17 1310  BP: 140/86 122/86  Pulse:    Resp: 16 16  Temp:    SpO2: 95% 98%    Last Pain:  Vitals:   03/16/17 1236  TempSrc: Oral      Patients Stated Pain Goal: 6 (47/18/55 0158)  Complications: No apparent anesthesia complications

## 2017-03-16 NOTE — Discharge Instructions (Signed)
Endoscopic Retrograde Cholangiopancreatogram, Care After This sheet gives you information about how to care for yourself after your procedure. Your health care provider may also give you more specific instructions. If you have problems or questions, contact your health care provider. What can I expect after the procedure? After the procedure, it is common to have:  Soreness in your throat.  Nausea.  Bloating.  Dizziness.  Tiredness (fatigue).  Follow these instructions at home:  Take over-the-counter and prescription medicines only as told by your health care provider.  Do not drive for 24 hours if you were given a medicine to help you relax (sedative) during your procedure. Have someone stay with you for 24 hours after the procedure.  Return to your normal activities as told by your health care provider. Ask your health care provider what activities are safe for you.  Return to eating what you normally do as soon as you feel well enough or as told by your health care provider.  Keep all follow-up visits as told by your health care provider. This is important. Contact a health care provider if:  You have pain in your abdomen that does not get better with medicine.  You develop signs of infection, such as: ? Chills. ? Feeling unwell. Get help right away if:  You have difficulty swallowing.  You have worsening pain in your throat, chest, or abdomen.  You vomit bright red blood or a substance that looks like coffee grounds.  You have bloody or very black stools.  You have a fever.  You have a sudden increase in swelling (bloating) in your abdomen. Summary  After the procedure, it is common to feel tired and to have some discomfort in your throat.  Contact your health care provider if you have signs of infection--such as chills or feeling unwell--or if you have pain that does not improve with medicine.  Get help right away if you have trouble swallowing, worsening  pain, bloody or black vomit, bloody or black stools, a fever, or increased swelling in your abdomen.  Keep all follow-up visits as told by your health care provider. This is important. This information is not intended to replace advice given to you by your health care provider. Make sure you discuss any questions you have with your health care provider. Document Released: 02/08/2013 Document Revised: 03/09/2016 Document Reviewed: 03/09/2016 Elsevier Interactive Patient Education  2017 [place] visit with [place] in 6 months

## 2017-03-16 NOTE — Progress Notes (Signed)
Patient seen in short stay. No change since seen in the office last week. Plan for ERCP with stent removal today.  ERCP procedure note from [facility] arrived. Patient had separative cholangitis/choledocholithiasis. Status post biliary sphincterotomy, biliary sphincterotomy balloon dilation and balloon stone extraction. Double pigtail stent placed.  Per plan, I have offered the patient an ERCP today with stent removal.  The risks, benefits, limitations, alternatives, and mponderable have been reviewed with the patient. I specifically discussed a1 in 10 chance of pancreatitis, reaction to medications, bleeding, perforation and the possibility of a failed ERCP. Potential for sphincterotomy and stent placement also reviewed. Questions have been answered. All parties agreeable.

## 2017-03-16 NOTE — Anesthesia Preprocedure Evaluation (Signed)
Anesthesia Evaluation  Patient identified by MRN, date of birth, ID band Patient awake    Reviewed: Allergy & Precautions, NPO status , Patient's Chart, lab work & pertinent test results  Airway        Dental   Pulmonary neg pulmonary ROS,    breath sounds clear to auscultation       Cardiovascular hypertension, Pt. on medications  Rhythm:Regular Rate:Normal     Neuro/Psych  Headaches, negative psych ROS   GI/Hepatic GERD  Medicated and Controlled,S/p ERCP with stent for cholangitis and CBD stones.   Endo/Other  negative endocrine ROS  Renal/GU negative Renal ROS     Musculoskeletal   Abdominal   Peds  Hematology negative hematology ROS (+)   Anesthesia Other Findings   Reproductive/Obstetrics                             Anesthesia Physical Anesthesia Plan  ASA: II  Anesthesia Plan: General   Post-op Pain Management:    Induction: Intravenous  PONV Risk Score and Plan:   Airway Management Planned: Oral ETT  Additional Equipment:   Intra-op Plan:   Post-operative Plan: Extubation in OR  Informed Consent: I have reviewed the patients History and Physical, chart, labs and discussed the procedure including the risks, benefits and alternatives for the proposed anesthesia with the patient or authorized representative who has indicated his/her understanding and acceptance.     Plan Discussed with:   Anesthesia Plan Comments:         Anesthesia Quick Evaluation

## 2017-03-16 NOTE — Anesthesia Postprocedure Evaluation (Signed)
Anesthesia Post Note  Patient: [name] [name]  Procedure(s) Performed: ENDOSCOPIC RETROGRADE CHOLANGIOPANCREATOGRAPHY (ERCP) (N/A ) GASTROINTESTINAL STENT REMOVAL (N/A )  Patient location during evaluation: PACU Anesthesia Type: General Level of consciousness: awake and alert and oriented Pain management: pain level controlled Vital Signs Assessment: post-procedure vital signs reviewed and stable Respiratory status: spontaneous breathing Cardiovascular status: blood pressure returned to baseline Postop Assessment: no apparent nausea or vomiting Anesthetic complications: no     Last Vitals:  Vitals:   03/16/17 1310 03/16/17 1420  BP: 122/86 106/75  Pulse:  83  Resp: 16 16  Temp:  36.9 C  SpO2: 98% 100%    Last Pain:  Vitals:   03/16/17 1236  TempSrc: Oral                 Makalya Nave

## 2017-03-16 NOTE — Op Note (Signed)
[facility] Patient Name: [name] [name] Procedure Date: 03/16/2017 1:12 PM MRN: [id number] Date of Birth: 03-Jan-1946 Attending MD: Norvel Richards , MD CSN: [id number] Age: 71 Admit Type: Outpatient Procedure:                ERCP Indications:              Biliary stent removal Providers:                Norvel Richards, MD, Rosina Lowenstein, RN, Aram Candela Referring MD:             Asencion Noble Medicines:                General Anesthesia, Unasyn 1.5 g IV, Indocin 100mg                             per rectum Complications:            No immediate complications. Estimated Blood Loss:     Estimated blood loss: none. Procedure:                Pre-Anesthesia Assessment:                           - Prior to the procedure, a History and Physical                            was performed, and patient medications and                            allergies were reviewed. The patient's tolerance of                            previous anesthesia was also reviewed. The risks                            and benefits of the procedure and the sedation                            options and risks were discussed with the patient.                            All questions were answered, and informed consent                            was obtained. Prior Anticoagulants: The patient has                            taken no previous anticoagulant or antiplatelet                            agents. ASA Grade Assessment: II - A patient with  mild systemic disease. After reviewing the risks                            and benefits, the patient was deemed in                            satisfactory condition to undergo the procedure.                           After obtaining informed consent, the scope was                            passed under direct vision. Throughout the                            procedure, the patient's blood pressure, pulse, and                           oxygen saturations were monitored continuously. The                            ED34-i10T ([id number]) scope was introduced through                            the mouth, and used to inject contrast into and                            used to cannulate the bile duct. The ERCP was                            accomplished without difficulty. The patient                            tolerated the procedure well. Scope In: 1:47:39 PM Scope Out: 1:59:52 PM Total Procedure Duration: 0 hours 12 minutes 13 seconds  Findings:      A biliary stent was visible on the scout film. evidence of prior       sphincterotomy noted. Pigtail stent protruding from the ampullary       orifice. Cursory examination of the distal esophagus, stomach and       duodenum otherwise appeared normal. , a snare grasped the luminal end of       the stent and pulled it out intact. Scope was reintroduced into the       duodenum. With the scope in at 55 cm from the incisors, a 0.044       Microvasive autotome was advanced into the biliary tree. I almost       immediately achieve biliary cannulation. Cholangiogram performed. There       was slight dilation of the biliary tree intra- extra-hepatic). Surgical       clips from cholecystectomy noted. There was free flow of clear yellow       bile stained fluid through the ampullary orifice after the cholangiogram       was performed. The pancreatic duct was not manipulated with       wire,injection or otherwise. Impression:  Status post prior sphincterotomy; status post                            removal of double pigtl biliary stent; Minimally                            dilated biliary tree diffusely without residual                            filling defects noted. Moderate Sedation:      Moderate (conscious) sedation was personally administered by an       anesthesia professional. The following parameters were monitored: oxygen       saturation,  heart rate, blood pressure, respiratory rate, EKG, adequacy       of pulmonary ventilation, and response to care. Total physician       intraservice time was 20 minutes. Recommendation:           - Clear liquid diet today, then advance as                            tolerated to advance diet. Call with any problems.                            Standard post ERCP discharge instructions provided. Procedure Code(s):        --- Professional ---                           [phone], Endoscopic retrograde                            cholangiopancreatography (ERCP); diagnostic,                            including collection of specimen(s) by brushing or                            washing, when performed (separate procedure) Diagnosis Code(s):        --- Professional ---                           Q75.91, Encounter for fitting and adjustment of                            other gastrointestinal appliance and device CPT copyright 2016 American Medical Association. All rights reserved. The codes documented in this report are preliminary and upon coder review may  be revised to meet current compliance requirements. Cristopher Estimable. Markella Dao, MD Norvel Richards, MD 03/16/2017 2:30:17 PM This report has been signed electronically. Number of Addenda: 0

## 2017-03-16 NOTE — Anesthesia Procedure Notes (Signed)
Procedure Name: Intubation Date/Time: 03/16/2017 1:33 PM Performed by: Ollen Bowl, CRNA Pre-anesthesia Checklist: Patient identified, Patient being monitored, Timeout performed, Emergency Drugs available and Suction available Patient Re-evaluated:Patient Re-evaluated prior to induction Oxygen Delivery Method: Circle system utilized Preoxygenation: Pre-oxygenation with 100% oxygen Induction Type: IV induction Ventilation: Mask ventilation without difficulty Laryngoscope Size: Mac and 3 Grade View: Grade I Tube type: Oral Tube size: 7.0 mm Number of attempts: 1 Airway Equipment and Method: Stylet Placement Confirmation: ETT inserted through vocal cords under direct vision,  positive ETCO2 and breath sounds checked- equal and bilateral Secured at: 21 cm Tube secured with: Tape Dental Injury: Teeth and Oropharynx as per pre-operative assessment

## 2017-03-18 ENCOUNTER — Encounter (HOSPITAL_COMMUNITY): Payer: Self-pay | Admitting: Internal Medicine

## 2017-04-01 DIAGNOSIS — L821 Other seborrheic keratosis: Secondary | ICD-10-CM | POA: Diagnosis not present

## 2017-04-01 DIAGNOSIS — L570 Actinic keratosis: Secondary | ICD-10-CM | POA: Diagnosis not present

## 2017-04-01 DIAGNOSIS — L814 Other melanin hyperpigmentation: Secondary | ICD-10-CM | POA: Diagnosis not present

## 2017-04-01 DIAGNOSIS — Z808 Family history of malignant neoplasm of other organs or systems: Secondary | ICD-10-CM | POA: Diagnosis not present

## 2017-04-01 DIAGNOSIS — D225 Melanocytic nevi of trunk: Secondary | ICD-10-CM | POA: Diagnosis not present

## 2017-04-01 DIAGNOSIS — D1801 Hemangioma of skin and subcutaneous tissue: Secondary | ICD-10-CM | POA: Diagnosis not present

## 2017-04-01 DIAGNOSIS — Z23 Encounter for immunization: Secondary | ICD-10-CM | POA: Diagnosis not present

## 2017-04-01 DIAGNOSIS — L723 Sebaceous cyst: Secondary | ICD-10-CM | POA: Diagnosis not present

## 2017-05-19 DIAGNOSIS — I671 Cerebral aneurysm, nonruptured: Secondary | ICD-10-CM | POA: Diagnosis not present

## 2017-05-19 DIAGNOSIS — N529 Male erectile dysfunction, unspecified: Secondary | ICD-10-CM | POA: Diagnosis not present

## 2017-05-19 DIAGNOSIS — E785 Hyperlipidemia, unspecified: Secondary | ICD-10-CM | POA: Diagnosis not present

## 2017-05-19 DIAGNOSIS — Z79899 Other long term (current) drug therapy: Secondary | ICD-10-CM | POA: Diagnosis not present

## 2017-05-19 DIAGNOSIS — I10 Essential (primary) hypertension: Secondary | ICD-10-CM | POA: Diagnosis not present

## 2017-05-19 DIAGNOSIS — Z125 Encounter for screening for malignant neoplasm of prostate: Secondary | ICD-10-CM | POA: Diagnosis not present

## 2017-05-19 DIAGNOSIS — K219 Gastro-esophageal reflux disease without esophagitis: Secondary | ICD-10-CM | POA: Diagnosis not present

## 2017-05-27 DIAGNOSIS — K219 Gastro-esophageal reflux disease without esophagitis: Secondary | ICD-10-CM | POA: Diagnosis not present

## 2017-05-27 DIAGNOSIS — E785 Hyperlipidemia, unspecified: Secondary | ICD-10-CM | POA: Diagnosis not present

## 2017-05-27 DIAGNOSIS — I10 Essential (primary) hypertension: Secondary | ICD-10-CM | POA: Diagnosis not present

## 2017-05-27 DIAGNOSIS — I493 Ventricular premature depolarization: Secondary | ICD-10-CM | POA: Diagnosis not present

## 2017-05-27 DIAGNOSIS — K200 Eosinophilic esophagitis: Secondary | ICD-10-CM | POA: Diagnosis not present

## 2017-07-07 DIAGNOSIS — S76219A Strain of adductor muscle, fascia and tendon of unspecified thigh, initial encounter: Secondary | ICD-10-CM | POA: Diagnosis not present

## 2017-09-17 ENCOUNTER — Encounter: Payer: Self-pay | Admitting: Internal Medicine

## 2017-09-17 ENCOUNTER — Ambulatory Visit (INDEPENDENT_AMBULATORY_CARE_PROVIDER_SITE_OTHER): Payer: MEDICARE | Admitting: Internal Medicine

## 2017-09-17 VITALS — BP 132/80 | HR 71 | Temp 97.1°F | Ht 73.0 in | Wt 200.0 lb

## 2017-09-17 DIAGNOSIS — K219 Gastro-esophageal reflux disease without esophagitis: Secondary | ICD-10-CM | POA: Diagnosis not present

## 2017-09-17 NOTE — Patient Instructions (Signed)
GERD information provided  Continue prilosec 20 mg daily  Office visit in 1 year

## 2017-09-17 NOTE — Progress Notes (Signed)
Primary Care Physician:  Asencion Noble, MD Primary Gastroenterologist:  Dr. Gala Romney  Pre-Procedure History & Physical: HPI:  [name] [name] is a 72 y.o. male here for follow-up. Episode of choledocholithiasis/cholecystitis which necessitated patient being evacuated off the cruciate last fall. I saw him earlier this year and removed his stent. He is done very well he got his gallbladder out at [facility]. Hasn't had any abdominal pain, whatsoever. Reflux symptoms continued be well controlled on Prilosec 20 mg daily.  Past Medical History:  Diagnosis Date  . Frequent headaches   . GERD (gastroesophageal reflux disease)   . Hyperlipidemia   . Hypertension   . Skin cancer    Squamous, basal-multiple    Past Surgical History:  Procedure Laterality Date  . APPENDECTOMY  1964  . CEREBRAL ANEURYSM REPAIR     age 39  . CHOLECYSTECTOMY    . COLONOSCOPY  06/28/2007   [id number] rectum, left-sided diverticula.  Remainder of colonic mucosa appeared normal, suboptimal prep of ascending segment made exam more difficult in this area.  . COLONOSCOPY N/A 11/18/2012   Procedure: COLONOSCOPY;  Surgeon: Daneil Dolin, MD;  [facility];  Service: Endoscopy;  Laterality: N/A;  8:15  . ERCP N/A 03/16/2017   Procedure: ENDOSCOPIC RETROGRADE CHOLANGIOPANCREATOGRAPHY (ERCP);  Surgeon: Daneil Dolin, MD;  [facility]: [facility];  Service: Endoscopy;  Laterality: N/A;  . ESOPHAGOGASTRODUODENOSCOPY N/A 11/18/2012   Procedure: ESOPHAGOGASTRODUODENOSCOPY (EGD);  Surgeon: Daneil Dolin, MD;  [facility];  Service: Endoscopy;  Laterality: N/A;  . GASTROINTESTINAL STENT REMOVAL N/A 03/16/2017   Procedure: GASTROINTESTINAL STENT REMOVAL;  Surgeon: Daneil Dolin, MD;  [facility];  Service: Endoscopy;  Laterality: N/A;    Prior to Admission medications   Medication Sig Start Date End Date Taking? Authorizing Provider  aspirin EC 81 MG tablet Take 81 mg daily by mouth.   Yes  [provider]  Coenzyme Q10 (CO Q 10) 100 MG CAPS Take 1 tablet by mouth daily.   Yes [provider]  losartan-hydrochlorothiazide (HYZAAR) 100-25 MG per tablet Take 1 tablet by mouth daily.  09/12/12  Yes [provider]  lovastatin (MEVACOR) 20 MG tablet Take 20 mg by mouth at bedtime.  10/17/12  Yes [provider]  Multiple Vitamin (MULTIVITAMIN WITH MINERALS) TABS Take 1 tablet by mouth daily.   Yes [provider]  omeprazole (PRILOSEC) 20 MG capsule Take 20 mg by mouth daily.  09/10/12  Yes [provider]  Saw Palmetto 450 MG CAPS Take 1 capsule daily by mouth.   Yes [provider]  sildenafil (VIAGRA) 25 MG tablet Take 25 mg daily as needed by mouth for erectile dysfunction.   Yes [provider]    Allergies as of 09/17/2017  . (No Known Allergies)    Family History  Problem Relation Age of Onset  . Colon cancer Mother        27    Social History   Socioeconomic History  . Marital status: Married    Spouse name: Not on file  . Number of children: 3  . Years of education: Not on file  . Highest education level: Not on file  Occupational History  . Occupation: retired  Scientific laboratory technician  . Financial resource strain: Not on file  . Food insecurity:    Worry: Not on file    Inability: Not on file  . Transportation needs:    Medical: Not on file  Non-medical: Not on file  Tobacco Use  . Smoking status: Never Smoker  . Smokeless tobacco: Never Used  Substance and Sexual Activity  . Alcohol use: Yes    Comment: rare  . Drug use: No  . Sexual activity: Yes    Birth control/protection: None  Lifestyle  . Physical activity:    Days per week: Not on file    Minutes per session: Not on file  . Stress: Not on file  Relationships  . Social connections:    Talks on phone: Not on file    Gets together: Not on file    Attends religious service: Not on file    Active member of club or organization:  Not on file    Attends meetings of clubs or organizations: Not on file    Relationship status: Not on file  . Intimate partner violence:    Fear of current or ex partner: Not on file    Emotionally abused: Not on file    Physically abused: Not on file    Forced sexual activity: Not on file  Other Topics Concern  . Not on file  Social History Narrative  . Not on file    Review of Systems: See HPI, otherwise negative ROS  Physical Exam: BP 132/80   Pulse 71   Temp (!) 97.1 F (36.2 C) (Oral)   Ht 6\' 1"  (1.854 m)   Wt 200 lb (90.7 kg)   BMI 26.39 kg/m  General:   Alert,  Well-developed, well-nourished, pleasant and cooperative in NAD Lungs:  Clear throughout to auscultation.   No wheezes, crackles, or rhonchi. No acute distress. Heart:  Regular rate and rhythm; no murmurs, clicks, rubs,  or gallops. Abdomen: Non-distended, normal bowel sounds.  Soft and nontender without appreciable mass or hepatosplenomegaly.  Pulses:  Normal pulses noted. Extremities:  Without clubbing or edema.  Impression:  Pleasant 72 year old gentleman who is fully recovered from a bout of cholecystitis, choledocholithiasis requiring cholecystectomy and ERCP. Stent removal here earlier in the year.  GERD symptoms continued to well controlled on Prilosec 20 mg daily.  Negative colonoscopy 2014. Positive family history of colon cancer in his mother but she was diagnosed in her 83s. Technically, this would put him in the average risk screening category. He would be 76 when offered a screening colonoscopy on the 10 year schedule. He voices today to me that he wishes to have one more but doesn't want to wait 10 years.  Recommendations: Continue Prilosec 20 mg daily  Plan see him back in one year.  We will further discuss timing one more screening colonoscopy when he returns in 1 year.     Notice: This dictation was prepared with Dragon dictation along with smaller phrase technology. Any transcriptional  errors that result from this process are unintentional and may not be corrected upon review.

## 2017-09-28 ENCOUNTER — Telehealth: Payer: Self-pay | Admitting: Internal Medicine

## 2017-09-28 NOTE — Telephone Encounter (Signed)
I tried to call the patient, no answer,lmom 

## 2017-09-28 NOTE — Telephone Encounter (Signed)
Please call patient, he has question about a bill that he knows nothing about, has already called billing and spoke to them and got no help  [phone]

## 2017-10-07 ENCOUNTER — Encounter: Payer: Self-pay | Admitting: Internal Medicine

## 2017-11-25 DIAGNOSIS — I10 Essential (primary) hypertension: Secondary | ICD-10-CM | POA: Diagnosis not present

## 2017-11-25 DIAGNOSIS — K210 Gastro-esophageal reflux disease with esophagitis: Secondary | ICD-10-CM | POA: Diagnosis not present

## 2018-01-24 DIAGNOSIS — Z23 Encounter for immunization: Secondary | ICD-10-CM | POA: Diagnosis not present

## 2018-04-05 DIAGNOSIS — D225 Melanocytic nevi of trunk: Secondary | ICD-10-CM | POA: Diagnosis not present

## 2018-04-05 DIAGNOSIS — L814 Other melanin hyperpigmentation: Secondary | ICD-10-CM | POA: Diagnosis not present

## 2018-04-05 DIAGNOSIS — Z808 Family history of malignant neoplasm of other organs or systems: Secondary | ICD-10-CM | POA: Diagnosis not present

## 2018-04-05 DIAGNOSIS — L821 Other seborrheic keratosis: Secondary | ICD-10-CM | POA: Diagnosis not present

## 2018-04-05 DIAGNOSIS — L820 Inflamed seborrheic keratosis: Secondary | ICD-10-CM | POA: Diagnosis not present

## 2018-04-05 DIAGNOSIS — L570 Actinic keratosis: Secondary | ICD-10-CM | POA: Diagnosis not present

## 2018-04-05 DIAGNOSIS — Z23 Encounter for immunization: Secondary | ICD-10-CM | POA: Diagnosis not present

## 2018-06-09 DIAGNOSIS — E785 Hyperlipidemia, unspecified: Secondary | ICD-10-CM | POA: Diagnosis not present

## 2018-06-09 DIAGNOSIS — I10 Essential (primary) hypertension: Secondary | ICD-10-CM | POA: Diagnosis not present

## 2018-06-09 DIAGNOSIS — Z79899 Other long term (current) drug therapy: Secondary | ICD-10-CM | POA: Diagnosis not present

## 2018-06-09 DIAGNOSIS — R7301 Impaired fasting glucose: Secondary | ICD-10-CM | POA: Diagnosis not present

## 2018-06-09 DIAGNOSIS — N529 Male erectile dysfunction, unspecified: Secondary | ICD-10-CM | POA: Diagnosis not present

## 2018-06-09 DIAGNOSIS — K219 Gastro-esophageal reflux disease without esophagitis: Secondary | ICD-10-CM | POA: Diagnosis not present

## 2018-06-09 DIAGNOSIS — Z125 Encounter for screening for malignant neoplasm of prostate: Secondary | ICD-10-CM | POA: Diagnosis not present

## 2018-06-16 DIAGNOSIS — Z6828 Body mass index (BMI) 28.0-28.9, adult: Secondary | ICD-10-CM | POA: Diagnosis not present

## 2018-06-16 DIAGNOSIS — R7301 Impaired fasting glucose: Secondary | ICD-10-CM | POA: Diagnosis not present

## 2018-06-16 DIAGNOSIS — I10 Essential (primary) hypertension: Secondary | ICD-10-CM | POA: Diagnosis not present

## 2018-06-16 DIAGNOSIS — E785 Hyperlipidemia, unspecified: Secondary | ICD-10-CM | POA: Diagnosis not present

## 2018-06-16 DIAGNOSIS — I493 Ventricular premature depolarization: Secondary | ICD-10-CM | POA: Diagnosis not present

## 2018-08-01 DIAGNOSIS — H9209 Otalgia, unspecified ear: Secondary | ICD-10-CM | POA: Diagnosis not present

## 2018-09-13 DIAGNOSIS — L723 Sebaceous cyst: Secondary | ICD-10-CM | POA: Diagnosis not present

## 2018-09-13 DIAGNOSIS — L820 Inflamed seborrheic keratosis: Secondary | ICD-10-CM | POA: Diagnosis not present

## 2018-09-13 DIAGNOSIS — L570 Actinic keratosis: Secondary | ICD-10-CM | POA: Diagnosis not present

## 2018-09-27 DIAGNOSIS — M25551 Pain in right hip: Secondary | ICD-10-CM | POA: Diagnosis not present

## 2018-09-27 DIAGNOSIS — M545 Low back pain: Secondary | ICD-10-CM | POA: Diagnosis not present

## 2018-12-05 DIAGNOSIS — N529 Male erectile dysfunction, unspecified: Secondary | ICD-10-CM | POA: Diagnosis not present

## 2018-12-05 DIAGNOSIS — I10 Essential (primary) hypertension: Secondary | ICD-10-CM | POA: Diagnosis not present

## 2018-12-13 DIAGNOSIS — L0100 Impetigo, unspecified: Secondary | ICD-10-CM | POA: Diagnosis not present

## 2018-12-27 DIAGNOSIS — L820 Inflamed seborrheic keratosis: Secondary | ICD-10-CM | POA: Diagnosis not present

## 2018-12-27 DIAGNOSIS — Z872 Personal history of diseases of the skin and subcutaneous tissue: Secondary | ICD-10-CM | POA: Diagnosis not present

## 2019-01-18 DIAGNOSIS — Z23 Encounter for immunization: Secondary | ICD-10-CM | POA: Diagnosis not present

## 2019-02-09 DIAGNOSIS — L723 Sebaceous cyst: Secondary | ICD-10-CM | POA: Diagnosis not present

## 2019-02-09 DIAGNOSIS — L720 Epidermal cyst: Secondary | ICD-10-CM | POA: Diagnosis not present

## 2019-04-10 DIAGNOSIS — L570 Actinic keratosis: Secondary | ICD-10-CM | POA: Diagnosis not present

## 2019-04-10 DIAGNOSIS — L814 Other melanin hyperpigmentation: Secondary | ICD-10-CM | POA: Diagnosis not present

## 2019-04-10 DIAGNOSIS — Z23 Encounter for immunization: Secondary | ICD-10-CM | POA: Diagnosis not present

## 2019-04-10 DIAGNOSIS — L821 Other seborrheic keratosis: Secondary | ICD-10-CM | POA: Diagnosis not present

## 2019-04-10 DIAGNOSIS — Z808 Family history of malignant neoplasm of other organs or systems: Secondary | ICD-10-CM | POA: Diagnosis not present

## 2019-04-10 DIAGNOSIS — D225 Melanocytic nevi of trunk: Secondary | ICD-10-CM | POA: Diagnosis not present

## 2019-06-12 DIAGNOSIS — I671 Cerebral aneurysm, nonruptured: Secondary | ICD-10-CM | POA: Diagnosis not present

## 2019-06-12 DIAGNOSIS — I10 Essential (primary) hypertension: Secondary | ICD-10-CM | POA: Diagnosis not present

## 2019-06-12 DIAGNOSIS — Z79899 Other long term (current) drug therapy: Secondary | ICD-10-CM | POA: Diagnosis not present

## 2019-06-12 DIAGNOSIS — K219 Gastro-esophageal reflux disease without esophagitis: Secondary | ICD-10-CM | POA: Diagnosis not present

## 2019-06-12 DIAGNOSIS — E785 Hyperlipidemia, unspecified: Secondary | ICD-10-CM | POA: Diagnosis not present

## 2019-06-12 DIAGNOSIS — R7301 Impaired fasting glucose: Secondary | ICD-10-CM | POA: Diagnosis not present

## 2019-06-12 DIAGNOSIS — Z125 Encounter for screening for malignant neoplasm of prostate: Secondary | ICD-10-CM | POA: Diagnosis not present

## 2019-06-23 DIAGNOSIS — E785 Hyperlipidemia, unspecified: Secondary | ICD-10-CM | POA: Diagnosis not present

## 2019-06-23 DIAGNOSIS — I10 Essential (primary) hypertension: Secondary | ICD-10-CM | POA: Diagnosis not present

## 2019-06-23 DIAGNOSIS — R7301 Impaired fasting glucose: Secondary | ICD-10-CM | POA: Diagnosis not present

## 2019-06-23 DIAGNOSIS — K219 Gastro-esophageal reflux disease without esophagitis: Secondary | ICD-10-CM | POA: Diagnosis not present

## 2019-08-19 ENCOUNTER — Encounter: Payer: Self-pay | Admitting: Emergency Medicine

## 2019-08-19 ENCOUNTER — Ambulatory Visit
Admission: EM | Admit: 2019-08-19 | Discharge: 2019-08-19 | Disposition: A | Discharge status: Home / Self Care | Payer: MEDICARE | Attending: Emergency Medicine | Admitting: Emergency Medicine

## 2019-08-19 ENCOUNTER — Other Ambulatory Visit: Payer: Self-pay

## 2019-08-19 DIAGNOSIS — M542 Cervicalgia: Secondary | ICD-10-CM

## 2019-08-19 MED ORDER — NAPROXEN 375 MG PO TABS: 375.0000 mg | ORAL_TABLET | Freq: Two times a day (BID) | ORAL | 0 refills | Status: DC

## 2019-08-19 NOTE — Discharge Instructions (Signed)
Exam is normal, I cannot feel a palpable lump or lymph node Continue conservative management of ice We will trial a short course of naproxen to help with pain and swelling Take naproxen as needed for pain relief (may cause abdominal discomfort, ulcers, and GI bleeds avoid taking with other NSAIDs) Follow up with PCP this upcoming week for recheck may consider imaging or blood work at that time Return or go to the ER if you have any new or worsening symptoms (fever, chills, chest pain, shortness of breath, ear pain, worsening symptoms despite treatment, etc...)

## 2019-08-19 NOTE — ED Triage Notes (Signed)
Noticed the area on Thursday after he finished mowing the yard. Area is tender.

## 2019-08-19 NOTE — ED Provider Notes (Signed)
[facility]   [id number] 08/19/19 Arrival Time: [id number]  CC: Ear PAIN  SUBJECTIVE: History from: patient. [name] [name] is a 74 y.o. male complains of LT sided ear/ neck pain that began 2 days ago.  Patient was wearing ear protection while mowing.  States he was removing ear protection when it hit underneath his left ear.  Localizes the pain to underneath the left ear.  Describes the pain as intermittent.  Has tried OTC tylenol with minimal relief.  Symptoms are made worse to the touch.  Denies similar symptoms in the past.  Denies fever, chills, ear pain, erythema, ecchymosis, CP, SOB.    ROS: As per HPI.  All other pertinent ROS negative.     Past Medical History:  Diagnosis Date  . Frequent headaches   . GERD (gastroesophageal reflux disease)   . Hyperlipidemia   . Hypertension   . Skin cancer    Squamous, basal-multiple   Past Surgical History:  Procedure Laterality Date  . APPENDECTOMY  1964  . CEREBRAL ANEURYSM REPAIR     age 60  . CHOLECYSTECTOMY    . COLONOSCOPY  06/28/2007   [id number] rectum, left-sided diverticula.  Remainder of colonic mucosa appeared normal, suboptimal prep of ascending segment made exam more difficult in this area.  . COLONOSCOPY N/A 11/18/2012   Procedure: COLONOSCOPY;  Surgeon: Daneil Dolin, MD;  [facility];  Service: Endoscopy;  Laterality: N/A;  8:15  . ERCP N/A 03/16/2017   Procedure: ENDOSCOPIC RETROGRADE CHOLANGIOPANCREATOGRAPHY (ERCP);  Surgeon: Daneil Dolin, MD;  [facility];  Service: Endoscopy;  Laterality: N/A;  . ESOPHAGOGASTRODUODENOSCOPY N/A 11/18/2012   Procedure: ESOPHAGOGASTRODUODENOSCOPY (EGD);  Surgeon: Daneil Dolin, MD;  [facility];  Service: Endoscopy;  Laterality: N/A;  . GASTROINTESTINAL STENT REMOVAL N/A 03/16/2017   Procedure: GASTROINTESTINAL STENT REMOVAL;  Surgeon: Daneil Dolin, MD;  [facility];  Service: Endoscopy;  Laterality: N/A;   No Known  Allergies No current facility-administered medications on file prior to encounter.   Current Outpatient Medications on File Prior to Encounter  Medication Sig Dispense Refill  . aspirin EC 81 MG tablet Take 81 mg daily by mouth.    . Coenzyme Q10 (CO Q 10) 100 MG CAPS Take 1 tablet by mouth daily.    Marland Kitchen losartan-hydrochlorothiazide (HYZAAR) 100-25 MG per tablet Take 1 tablet by mouth daily.     Marland Kitchen lovastatin (MEVACOR) 20 MG tablet Take 20 mg by mouth at bedtime.     . Multiple Vitamin (MULTIVITAMIN WITH MINERALS) TABS Take 1 tablet by mouth daily.    Marland Kitchen omeprazole (PRILOSEC) 20 MG capsule Take 20 mg by mouth daily.     . Saw Palmetto 450 MG CAPS Take 1 capsule daily by mouth.    . sildenafil (VIAGRA) 25 MG tablet Take 25 mg daily as needed by mouth for erectile dysfunction.     Social History   Socioeconomic History  . Marital status: Married    Spouse name: Not on file  . Number of children: 3  . Years of education: Not on file  . Highest education level: Not on file  Occupational History  . Occupation: retired  Tobacco Use  . Smoking status: Never Smoker  . Smokeless tobacco: Never Used  Substance and Sexual Activity  . Alcohol use: Yes    Comment: rare  . Drug use: No  . Sexual activity: Yes    Birth control/protection: None  Other Topics Concern  .  Not on file  Social History Narrative  . Not on file   Social Determinants of Health   Financial Resource Strain:   . Difficulty of Paying Living Expenses:   Food Insecurity:   . Worried About Charity fundraiser in the Last Year:   . Arboriculturist in the Last Year:   Transportation Needs:   . Film/video editor (Medical):   Marland Kitchen Lack of Transportation (Non-Medical):   Physical Activity:   . Days of Exercise per Week:   . Minutes of Exercise per Session:   Stress:   . Feeling of Stress :   Social Connections:   . Frequency of Communication with Friends and Family:   . Frequency of Social Gatherings with Friends  and Family:   . Attends Religious Services:   . Active Member of Clubs or Organizations:   . Attends Archivist Meetings:   Marland Kitchen Marital Status:   Intimate Partner Violence:   . Fear of Current or Ex-Partner:   . Emotionally Abused:   Marland Kitchen Physically Abused:   . Sexually Abused:    Family History  Problem Relation Age of Onset  . Colon cancer Mother        34    OBJECTIVE:  Vitals:   08/19/19 1245  BP: (!) 170/73  Pulse: 87  Resp: 18  Temp: 97.9 F (36.6 C)  TempSrc: Oral  SpO2: 94%    General appearance: ALERT; in no acute distress.  HENT: NCAT; EACs clear, TMs pearly gray, no mastoid tenderness or erythema; PERRL, EOMI grossly; nares patent; oropharynx clear Neck: TTP over superior aspect of LT lateral neck just inferior to LT ear, no palpable mass, lumps, lymph nodes or nodules appreciated Lungs: Normal respiratory effort Skin: warm and dry Neurologic: Ambulates without difficulty Psychological: alert and cooperative; normal mood and affect  ASSESSMENT & PLAN:  1. Neck pain on left side     Meds ordered this encounter  Medications  . naproxen (NAPROSYN) 375 MG tablet    Sig: Take 1 tablet (375 mg total) by mouth 2 (two) times daily.    Dispense:  20 tablet    Refill:  0    Order Specific Question:   Supervising Provider    Answer:   Raylene Everts [id number]   Exam is normal, I cannot feel a palpable lump or lymph node Continue conservative management of ice We will trial a short course of naproxen to help with pain and swelling Take naproxen as needed for pain relief (may cause abdominal discomfort, ulcers, and GI bleeds avoid taking with other NSAIDs) Follow up with PCP this upcoming week for recheck may consider imaging or blood work at that time Return or go to the ER if you have any new or worsening symptoms (fever, chills, chest pain, shortness of breath, ear pain, worsening symptoms despite treatment, etc...)   Reviewed expectations re:  course of current medical issues. Questions answered. Outlined signs and symptoms indicating need for more acute intervention. Patient verbalized understanding. After Visit Summary given.    Lestine Box, PA-C 08/19/19 1308

## 2019-08-19 NOTE — ED Triage Notes (Signed)
He has been using warm compresses to the area.

## 2019-10-09 DIAGNOSIS — L570 Actinic keratosis: Secondary | ICD-10-CM | POA: Diagnosis not present

## 2019-10-09 DIAGNOSIS — L821 Other seborrheic keratosis: Secondary | ICD-10-CM | POA: Diagnosis not present

## 2019-12-21 DIAGNOSIS — I10 Essential (primary) hypertension: Secondary | ICD-10-CM | POA: Diagnosis not present

## 2019-12-21 DIAGNOSIS — K219 Gastro-esophageal reflux disease without esophagitis: Secondary | ICD-10-CM | POA: Diagnosis not present

## 2020-01-18 ENCOUNTER — Other Ambulatory Visit: Payer: Self-pay

## 2020-01-18 ENCOUNTER — Ambulatory Visit
Admission: EM | Admit: 2020-01-18 | Discharge: 2020-01-18 | Disposition: A | Discharge status: Home / Self Care | Payer: MEDICARE | Attending: Emergency Medicine | Admitting: Emergency Medicine

## 2020-01-18 ENCOUNTER — Encounter: Payer: Self-pay | Admitting: Emergency Medicine

## 2020-01-18 DIAGNOSIS — J069 Acute upper respiratory infection, unspecified: Secondary | ICD-10-CM

## 2020-01-18 DIAGNOSIS — R05 Cough: Secondary | ICD-10-CM | POA: Diagnosis not present

## 2020-01-18 DIAGNOSIS — Z1152 Encounter for screening for COVID-19: Secondary | ICD-10-CM | POA: Diagnosis not present

## 2020-01-18 MED ORDER — DEXAMETHASONE 4 MG PO TABS: 4.0000 mg | ORAL_TABLET | Freq: Every day | ORAL | 0 refills | Status: AC

## 2020-01-18 MED ORDER — BENZONATATE 100 MG PO CAPS: 100.0000 mg | ORAL_CAPSULE | Freq: Three times a day (TID) | ORAL | 0 refills | Status: DC

## 2020-01-18 MED ORDER — FLUTICASONE PROPIONATE 50 MCG/ACT NA SUSP: 1.0000 | Freq: Every day | NASAL | 0 refills | Status: DC

## 2020-01-18 MED ORDER — CETIRIZINE HCL 10 MG PO TABS: 10.0000 mg | ORAL_TABLET | Freq: Every day | ORAL | 0 refills | Status: DC

## 2020-01-18 NOTE — ED Triage Notes (Addendum)
Cough and irritated throat since last night

## 2020-01-18 NOTE — ED Provider Notes (Signed)
[place]   [id number] 01/18/20 Arrival Time: 5361   CC: COVID symptoms  SUBJECTIVE: History from: patient.  [name] [name] is a 74 y.o. male who presents to the urgent care for complaint of sore throat, cough for the past few days.  Denies sick exposure to COVID, flu or strep.  Denies recent travel.  Has tried OTC medication without relief.  Denies aggravating factors.  Denies previous symptoms in the past.   Denies fever, chills, fatigue, sinus pain, rhinorrhea, sore throat, SOB, wheezing, chest pain, nausea, changes in bowel or bladder habits.      ROS: As per HPI.  All other pertinent ROS negative.     Past Medical History:  Diagnosis Date  . Frequent headaches   . GERD (gastroesophageal reflux disease)   . Hyperlipidemia   . Hypertension   . Skin cancer    Squamous, basal-multiple   Past Surgical History:  Procedure Laterality Date  . APPENDECTOMY  1964  . CEREBRAL ANEURYSM REPAIR     age 65  . CHOLECYSTECTOMY    . COLONOSCOPY  06/28/2007   WER:[id number] rectum, left-sided diverticula.  Remainder of colonic mucosa appeared normal, suboptimal prep of ascending segment made exam more difficult in this area.  . COLONOSCOPY N/A 11/18/2012   Procedure: COLONOSCOPY;  Surgeon: Daneil Dolin, MD;  [facility];  Service: Endoscopy;  Laterality: N/A;  8:15  . ERCP N/A 03/16/2017   Procedure: ENDOSCOPIC RETROGRADE CHOLANGIOPANCREATOGRAPHY (ERCP);  Surgeon: Daneil Dolin, MD;  [facility]: [facility];  Service: Endoscopy;  Laterality: N/A;  . ESOPHAGOGASTRODUODENOSCOPY N/A 11/18/2012   Procedure: ESOPHAGOGASTRODUODENOSCOPY (EGD);  Surgeon: Daneil Dolin, MD;  [facility]: [facility];  Service: Endoscopy;  Laterality: N/A;  . GASTROINTESTINAL STENT REMOVAL N/A 03/16/2017   Procedure: GASTROINTESTINAL STENT REMOVAL;  Surgeon: Daneil Dolin, MD;  [facility];  Service: Endoscopy;  Laterality: N/A;   No Known Allergies No current  facility-administered medications on file prior to encounter.   Current Outpatient Medications on File Prior to Encounter  Medication Sig Dispense Refill  . aspirin EC 81 MG tablet Take 81 mg daily by mouth.    . Coenzyme Q10 (CO Q 10) 100 MG CAPS Take 1 tablet by mouth daily.    Marland Kitchen losartan-hydrochlorothiazide (HYZAAR) 100-25 MG per tablet Take 1 tablet by mouth daily.     Marland Kitchen lovastatin (MEVACOR) 20 MG tablet Take 20 mg by mouth at bedtime.     . Multiple Vitamin (MULTIVITAMIN WITH MINERALS) TABS Take 1 tablet by mouth daily.    . naproxen (NAPROSYN) 375 MG tablet Take 1 tablet (375 mg total) by mouth 2 (two) times daily. 20 tablet 0  . omeprazole (PRILOSEC) 20 MG capsule Take 20 mg by mouth daily.     . Saw Palmetto 450 MG CAPS Take 1 capsule daily by mouth.    . sildenafil (VIAGRA) 25 MG tablet Take 25 mg daily as needed by mouth for erectile dysfunction.     Social History   Socioeconomic History  . Marital status: Married    Spouse name: Not on file  . Number of children: 3  . Years of education: Not on file  . Highest education level: Not on file  Occupational History  . Occupation: retired  Tobacco Use  . Smoking status: Never Smoker  . Smokeless tobacco: Never Used  Vaping Use  . Vaping Use: Never used  Substance and Sexual Activity  . Alcohol use: Yes  Comment: rare  . Drug use: No  . Sexual activity: Yes    Birth control/protection: None  Other Topics Concern  . Not on file  Social History Narrative  . Not on file   Social Determinants of Health   Financial Resource Strain:   . Difficulty of Paying Living Expenses: Not on file  Food Insecurity:   . Worried About Charity fundraiser in the Last Year: Not on file  . Ran Out of Food in the Last Year: Not on file  Transportation Needs:   . Lack of Transportation (Medical): Not on file  . Lack of Transportation (Non-Medical): Not on file  Physical Activity:   . Days of Exercise per Week: Not on file  . Minutes  of Exercise per Session: Not on file  Stress:   . Feeling of Stress : Not on file  Social Connections:   . Frequency of Communication with Friends and Family: Not on file  . Frequency of Social Gatherings with Friends and Family: Not on file  . Attends Religious Services: Not on file  . Active Member of Clubs or Organizations: Not on file  . Attends Archivist Meetings: Not on file  . Marital Status: Not on file  Intimate Partner Violence:   . Fear of Current or Ex-Partner: Not on file  . Emotionally Abused: Not on file  . Physically Abused: Not on file  . Sexually Abused: Not on file   Family History  Problem Relation Age of Onset  . Colon cancer Mother        39    OBJECTIVE:  Vitals:   01/18/20 0943 01/18/20 0945  BP: (!) 154/76   Pulse: 77   Resp: 19   Temp: 98.8 F (37.1 C)   TempSrc: Oral   SpO2: 94%   Weight:  198 lb 6.6 oz (90 kg)  Height:  6\' 1"  (1.854 m)     General appearance: alert; appears fatigued, but nontoxic; speaking in full sentences and tolerating own secretions HEENT: NCAT; Ears: EACs clear, TMs pearly gray; Eyes: PERRL.  EOM grossly intact. Sinuses: nontender; Nose: nares patent without rhinorrhea, Throat: oropharynx clear, tonsils non erythematous or enlarged, uvula midline  Neck: supple without LAD Lungs: unlabored respirations, symmetrical air entry; cough: moderate; no respiratory distress; CTAB Heart: regular rate and rhythm.  Radial pulses 2+ symmetrical bilaterally Skin: warm and dry Psychological: alert and cooperative; normal mood and affect  LABS:  No results found for this or any previous visit (from the past 24 hour(s)).   ASSESSMENT & PLAN:  1. Viral URI with cough   2. Encounter for screening for COVID-19     Meds ordered this encounter  Medications  . cetirizine (ZYRTEC ALLERGY) 10 MG tablet    Sig: Take 1 tablet (10 mg total) by mouth daily.    Dispense:  30 tablet    Refill:  0  . fluticasone (FLONASE) 50  MCG/ACT nasal spray    Sig: Place 1 spray into both nostrils daily for 14 days.    Dispense:  16 g    Refill:  0  . benzonatate (TESSALON) 100 MG capsule    Sig: Take 1 capsule (100 mg total) by mouth every 8 (eight) hours.    Dispense:  30 capsule    Refill:  0  . dexamethasone (DECADRON) 4 MG tablet    Sig: Take 1 tablet (4 mg total) by mouth daily for 5 days.    Dispense:  5  tablet    Refill:  0    Discharge instructions.    COVID testing ordered.  It will take between 2-7 days for test results.  Someone will contact you regarding abnormal results.    In the meantime: You should remain isolated in your home for 10 days from symptom onset AND greater than 24 hours after symptoms resolution (absence of fever without the use of fever-reducing medication and improvement in respiratory symptoms), whichever is longer Get plenty of rest and push fluids Tessalon Perles prescribed for cough Zyrtec for nasal congestion, runny nose, and/or sore throat Flonase for nasal congestion and runny nose Decadron was prescribed Use medications daily for symptom relief Use OTC medications like ibuprofen or tylenol as needed fever or pain Call or go to the ED if you have any new or worsening symptoms such as fever, worsening cough, shortness of breath, chest tightness, chest pain, turning blue, changes in mental status, etc...   Reviewed expectations re: course of current medical issues. Questions answered. Outlined signs and symptoms indicating need for more acute intervention. Patient verbalized understanding. After Visit Summary given.         Emerson Monte, [place] 01/18/20 1015

## 2020-01-18 NOTE — Discharge Instructions (Addendum)

## 2020-01-20 LAB — NOVEL CORONAVIRUS, NAA: SARS-CoV-2, NAA: NOT DETECTED

## 2020-01-20 LAB — SARS-COV-2, NAA 2 DAY TAT

## 2020-02-06 DIAGNOSIS — Z23 Encounter for immunization: Secondary | ICD-10-CM | POA: Diagnosis not present

## 2020-04-15 DIAGNOSIS — L570 Actinic keratosis: Secondary | ICD-10-CM | POA: Diagnosis not present

## 2020-04-15 DIAGNOSIS — D225 Melanocytic nevi of trunk: Secondary | ICD-10-CM | POA: Diagnosis not present

## 2020-04-15 DIAGNOSIS — L820 Inflamed seborrheic keratosis: Secondary | ICD-10-CM | POA: Diagnosis not present

## 2020-04-15 DIAGNOSIS — L821 Other seborrheic keratosis: Secondary | ICD-10-CM | POA: Diagnosis not present

## 2020-04-15 DIAGNOSIS — Z808 Family history of malignant neoplasm of other organs or systems: Secondary | ICD-10-CM | POA: Diagnosis not present

## 2020-04-15 DIAGNOSIS — L578 Other skin changes due to chronic exposure to nonionizing radiation: Secondary | ICD-10-CM | POA: Diagnosis not present

## 2020-04-15 DIAGNOSIS — L814 Other melanin hyperpigmentation: Secondary | ICD-10-CM | POA: Diagnosis not present

## 2020-06-26 DIAGNOSIS — K219 Gastro-esophageal reflux disease without esophagitis: Secondary | ICD-10-CM | POA: Diagnosis not present

## 2020-06-26 DIAGNOSIS — R7301 Impaired fasting glucose: Secondary | ICD-10-CM | POA: Diagnosis not present

## 2020-06-26 DIAGNOSIS — E785 Hyperlipidemia, unspecified: Secondary | ICD-10-CM | POA: Diagnosis not present

## 2020-06-26 DIAGNOSIS — Z79899 Other long term (current) drug therapy: Secondary | ICD-10-CM | POA: Diagnosis not present

## 2020-06-26 DIAGNOSIS — Z125 Encounter for screening for malignant neoplasm of prostate: Secondary | ICD-10-CM | POA: Diagnosis not present

## 2020-06-26 DIAGNOSIS — I10 Essential (primary) hypertension: Secondary | ICD-10-CM | POA: Diagnosis not present

## 2020-06-26 DIAGNOSIS — I671 Cerebral aneurysm, nonruptured: Secondary | ICD-10-CM | POA: Diagnosis not present

## 2020-07-01 DIAGNOSIS — E785 Hyperlipidemia, unspecified: Secondary | ICD-10-CM | POA: Diagnosis not present

## 2020-07-01 DIAGNOSIS — I10 Essential (primary) hypertension: Secondary | ICD-10-CM | POA: Diagnosis not present

## 2020-07-01 DIAGNOSIS — Z6827 Body mass index (BMI) 27.0-27.9, adult: Secondary | ICD-10-CM | POA: Diagnosis not present

## 2020-07-01 DIAGNOSIS — I493 Ventricular premature depolarization: Secondary | ICD-10-CM | POA: Diagnosis not present

## 2020-07-01 DIAGNOSIS — K219 Gastro-esophageal reflux disease without esophagitis: Secondary | ICD-10-CM | POA: Diagnosis not present

## 2020-08-09 ENCOUNTER — Other Ambulatory Visit: Payer: Self-pay

## 2020-08-09 ENCOUNTER — Encounter: Payer: Self-pay | Admitting: Internal Medicine

## 2020-08-09 ENCOUNTER — Ambulatory Visit (INDEPENDENT_AMBULATORY_CARE_PROVIDER_SITE_OTHER): Payer: MEDICARE | Admitting: Internal Medicine

## 2020-08-09 VITALS — BP 128/75 | HR 66 | Temp 97.3°F | Ht 73.0 in | Wt 195.4 lb

## 2020-08-09 DIAGNOSIS — K219 Gastro-esophageal reflux disease without esophagitis: Secondary | ICD-10-CM

## 2020-08-09 NOTE — Patient Instructions (Signed)
As discussed, we will schedule a high risk screening colonoscopy (Positive family history of colon cancer and colon polyps).  Conscious sedation.  ASA 2.  Further recommendations to follow after colonoscopy has been completed.

## 2020-08-09 NOTE — Progress Notes (Signed)
Primary Care Physician:  Asencion Noble, MD Primary Gastroenterologist:  Dr. Gala Romney  Pre-Procedure History & Physical: HPI:  [name] [name] is a 75 y.o. male here for for consideration of a updated colonoscopy.  Patient has really no lower GI tract symptoms.  He notes occasional bloating after he eats a big meal.  He has had no rectal bleeding.  Negative colonoscopy 2014.  Mother had colon cancer; brother recently found to have colon polyps.  He is somewhat overdue for his 5-year recommended follow-up screening examination.  GERD symptoms well controlled on omeprazole 20 mg daily no dysphagia.  Past Medical History:  Diagnosis Date  . Frequent headaches   . GERD (gastroesophageal reflux disease)   . Hyperlipidemia   . Hypertension   . Skin cancer    Squamous, basal-multiple    Past Surgical History:  Procedure Laterality Date  . APPENDECTOMY  1964  . CEREBRAL ANEURYSM REPAIR     age 29  . CHOLECYSTECTOMY    . COLONOSCOPY  06/28/2007   QJJ:HERDEY rectum, left-sided diverticula.  Remainder of colonic mucosa appeared normal, suboptimal prep of ascending segment made exam more difficult in this area.  . COLONOSCOPY N/A 11/18/2012   Procedure: COLONOSCOPY;  Surgeon: Daneil Dolin, MD;  [facility];  Service: Endoscopy;  Laterality: N/A;  8:15  . ERCP N/A 03/16/2017   Procedure: ENDOSCOPIC RETROGRADE CHOLANGIOPANCREATOGRAPHY (ERCP);  Surgeon: Daneil Dolin, MD;  [facility];  Service: Endoscopy;  Laterality: N/A;  . ESOPHAGOGASTRODUODENOSCOPY N/A 11/18/2012   Procedure: ESOPHAGOGASTRODUODENOSCOPY (EGD);  Surgeon: Daneil Dolin, MD;  [facility];  Service: Endoscopy;  Laterality: N/A;  . GASTROINTESTINAL STENT REMOVAL N/A 03/16/2017   Procedure: GASTROINTESTINAL STENT REMOVAL;  Surgeon: Daneil Dolin, MD;  [facility];  Service: Endoscopy;  Laterality: N/A;    Prior to Admission medications   Medication Sig Start Date End Date  Taking? Authorizing Provider  Coenzyme Q10 (CO Q 10) 100 MG CAPS Take 1 tablet by mouth daily.   Yes [provider]  diphenhydrAMINE (BENADRYL) 25 MG tablet Take 25 mg by mouth every 6 (six) hours as needed.   Yes [provider]  losartan-hydrochlorothiazide (HYZAAR) 100-25 MG per tablet Take 1 tablet by mouth daily. 09/12/12  Yes [provider]  lovastatin (MEVACOR) 20 MG tablet Take 20 mg by mouth at bedtime. 10/17/12  Yes [provider]  Multiple Vitamin (MULTIVITAMIN WITH MINERALS) TABS Take 1 tablet by mouth daily.   Yes [provider]  omeprazole (PRILOSEC) 20 MG capsule Take 20 mg by mouth daily. 09/10/12  Yes [provider]  Saw Palmetto 450 MG CAPS Take 1 capsule daily by mouth.   Yes [provider]  sildenafil (VIAGRA) 25 MG tablet Take 25 mg daily as needed by mouth for erectile dysfunction.   Yes [provider]    Allergies as of 08/09/2020  . (No Known Allergies)    Family History  Problem Relation Age of Onset  . Colon cancer Mother        5    Social History   Socioeconomic History  . Marital status: Married    Spouse name: Not on file  . Number of children: 3  . Years of education: Not on file  . Highest education level: Not on file  Occupational History  . Occupation: retired  Tobacco Use  . Smoking status: Never Smoker  . Smokeless tobacco: Never Used  Vaping Use  .  Vaping Use: Never used  Substance and Sexual Activity  . Alcohol use: Not Currently    Comment: rare  . Drug use: No  . Sexual activity: Yes    Birth control/protection: None  Other Topics Concern  . Not on file  Social History Narrative  . Not on file   Social Determinants of Health   Financial Resource Strain: Not on file  Food Insecurity: Not on file  Transportation Needs: Not on file  Physical Activity: Not on file  Stress: Not on file  Social Connections: Not on file  Intimate Partner Violence: Not on  file    Review of Systems: See HPI, otherwise negative ROS  Physical Exam: BP 128/75   Pulse 66   Temp (!) 97.3 F (36.3 C) (Temporal)   Ht 6\' 1"  (1.854 m)   Wt 195 lb 6.4 oz (88.6 kg)   BMI 25.78 kg/m  General:   Alert,  pleasant and cooperative in NAD Neck:  Supple; no masses or thyromegaly. No significant cervical adenopathy. Lungs:  Clear throughout to auscultation.   No wheezes, crackles, or rhonchi. No acute distress. Heart:  Regular rate and rhythm; no murmurs, clicks, rubs,  or gallops. Abdomen: Non-distended, normal bowel sounds.  Soft and nontender without appreciable mass or hepatosplenomegaly.  Pulses:  Normal pulses noted. Extremities:  Without clubbing or edema.  Impression/Plan: 75 year old gentleman with a positive family history of colon polyps and cancer in first-degree relatives.  He presents for consideration of an updated high risk screening colonoscopy. For his age, he remains in pretty good shape and I feel an updated screening colonoscopy is appropriate and is recommended.  GERD well-controlled on omeprazole.    Recommendations:  I have offered the patient a high risk colonoscopy in the near future.  Family history of colon cancer and polyps. The risks, benefits, limitations, alternatives and imponderables have been reviewed with the patient. Questions have been answered. All parties are agreeable.  ASA 2.  He should do well with conscious sedation.  Continue omeprazole 20 mg daily.  Further recommendations to follow after colonoscopy has been completed.     Notice: This dictation was prepared with Dragon dictation along with smaller phrase technology. Any transcriptional errors that result from this process are unintentional and may not be corrected upon review.

## 2020-08-09 NOTE — H&P (View-Only) (Signed)
Primary Care Physician:  Asencion Noble, MD Primary Gastroenterologist:  Dr. Gala Romney  Pre-Procedure History & Physical: HPI:  [name] [name] is a 75 y.o. male here for for consideration of a updated colonoscopy.  Patient has really no lower GI tract symptoms.  He notes occasional bloating after he eats a big meal.  He has had no rectal bleeding.  Negative colonoscopy 2014.  Mother had colon cancer; brother recently found to have colon polyps.  He is somewhat overdue for his 5-year recommended follow-up screening examination.  GERD symptoms well controlled on omeprazole 20 mg daily no dysphagia.  Past Medical History:  Diagnosis Date  . Frequent headaches   . GERD (gastroesophageal reflux disease)   . Hyperlipidemia   . Hypertension   . Skin cancer    Squamous, basal-multiple    Past Surgical History:  Procedure Laterality Date  . APPENDECTOMY  1964  . CEREBRAL ANEURYSM REPAIR     age 34  . CHOLECYSTECTOMY    . COLONOSCOPY  06/28/2007   VFI:EPPIRJ rectum, left-sided diverticula.  Remainder of colonic mucosa appeared normal, suboptimal prep of ascending segment made exam more difficult in this area.  . COLONOSCOPY N/A 11/18/2012   Procedure: COLONOSCOPY;  Surgeon: Daneil Dolin, MD;  [facility];  Service: Endoscopy;  Laterality: N/A;  8:15  . ERCP N/A 03/16/2017   Procedure: ENDOSCOPIC RETROGRADE CHOLANGIOPANCREATOGRAPHY (ERCP);  Surgeon: Daneil Dolin, MD;  [facility];  Service: Endoscopy;  Laterality: N/A;  . ESOPHAGOGASTRODUODENOSCOPY N/A 11/18/2012   Procedure: ESOPHAGOGASTRODUODENOSCOPY (EGD);  Surgeon: Daneil Dolin, MD;  [facility];  Service: Endoscopy;  Laterality: N/A;  . GASTROINTESTINAL STENT REMOVAL N/A 03/16/2017   Procedure: GASTROINTESTINAL STENT REMOVAL;  Surgeon: Daneil Dolin, MD;  [facility];  Service: Endoscopy;  Laterality: N/A;    Prior to Admission medications   Medication Sig Start Date End Date  Taking? Authorizing Provider  Coenzyme Q10 (CO Q 10) 100 MG CAPS Take 1 tablet by mouth daily.   Yes [provider]  diphenhydrAMINE (BENADRYL) 25 MG tablet Take 25 mg by mouth every 6 (six) hours as needed.   Yes [provider]  losartan-hydrochlorothiazide (HYZAAR) 100-25 MG per tablet Take 1 tablet by mouth daily. 09/12/12  Yes [provider]  lovastatin (MEVACOR) 20 MG tablet Take 20 mg by mouth at bedtime. 10/17/12  Yes [provider]  Multiple Vitamin (MULTIVITAMIN WITH MINERALS) TABS Take 1 tablet by mouth daily.   Yes [provider]  omeprazole (PRILOSEC) 20 MG capsule Take 20 mg by mouth daily. 09/10/12  Yes [provider]  Saw Palmetto 450 MG CAPS Take 1 capsule daily by mouth.   Yes [provider]  sildenafil (VIAGRA) 25 MG tablet Take 25 mg daily as needed by mouth for erectile dysfunction.   Yes [provider]    Allergies as of 08/09/2020  . (No Known Allergies)    Family History  Problem Relation Age of Onset  . Colon cancer Mother        8    Social History   Socioeconomic History  . Marital status: Married    Spouse name: Not on file  . Number of children: 3  . Years of education: Not on file  . Highest education level: Not on file  Occupational History  . Occupation: retired  Tobacco Use  . Smoking status: Never Smoker  . Smokeless tobacco: Never Used  Vaping Use  .  Vaping Use: Never used  Substance and Sexual Activity  . Alcohol use: Not Currently    Comment: rare  . Drug use: No  . Sexual activity: Yes    Birth control/protection: None  Other Topics Concern  . Not on file  Social History Narrative  . Not on file   Social Determinants of Health   Financial Resource Strain: Not on file  Food Insecurity: Not on file  Transportation Needs: Not on file  Physical Activity: Not on file  Stress: Not on file  Social Connections: Not on file  Intimate Partner Violence: Not on  file    Review of Systems: See HPI, otherwise negative ROS  Physical Exam: BP 128/75   Pulse 66   Temp (!) 97.3 F (36.3 C) (Temporal)   Ht 6\' 1"  (1.854 m)   Wt 195 lb 6.4 oz (88.6 kg)   BMI 25.78 kg/m  General:   Alert,  pleasant and cooperative in NAD Neck:  Supple; no masses or thyromegaly. No significant cervical adenopathy. Lungs:  Clear throughout to auscultation.   No wheezes, crackles, or rhonchi. No acute distress. Heart:  Regular rate and rhythm; no murmurs, clicks, rubs,  or gallops. Abdomen: Non-distended, normal bowel sounds.  Soft and nontender without appreciable mass or hepatosplenomegaly.  Pulses:  Normal pulses noted. Extremities:  Without clubbing or edema.  Impression/Plan: 75 year old gentleman with a positive family history of colon polyps and cancer in first-degree relatives.  He presents for consideration of an updated high risk screening colonoscopy. For his age, he remains in pretty good shape and I feel an updated screening colonoscopy is appropriate and is recommended.  GERD well-controlled on omeprazole.    Recommendations:  I have offered the patient a high risk colonoscopy in the near future.  Family history of colon cancer and polyps. The risks, benefits, limitations, alternatives and imponderables have been reviewed with the patient. Questions have been answered. All parties are agreeable.  ASA 2.  He should do well with conscious sedation.  Continue omeprazole 20 mg daily.  Further recommendations to follow after colonoscopy has been completed.     Notice: This dictation was prepared with Dragon dictation along with smaller phrase technology. Any transcriptional errors that result from this process are unintentional and may not be corrected upon review.

## 2020-08-12 ENCOUNTER — Other Ambulatory Visit: Payer: Self-pay

## 2020-08-12 MED ORDER — PEG 3350-KCL-NA BICARB-NACL 420 G PO SOLR: 4000.0000 mL | ORAL | 0 refills | Status: AC

## 2020-08-15 DIAGNOSIS — K219 Gastro-esophageal reflux disease without esophagitis: Secondary | ICD-10-CM | POA: Diagnosis not present

## 2020-08-15 DIAGNOSIS — I10 Essential (primary) hypertension: Secondary | ICD-10-CM | POA: Diagnosis not present

## 2020-08-15 DIAGNOSIS — E785 Hyperlipidemia, unspecified: Secondary | ICD-10-CM | POA: Diagnosis not present

## 2020-09-02 ENCOUNTER — Other Ambulatory Visit (HOSPITAL_COMMUNITY)
Admission: RE | Admit: 2020-09-02 | Discharge: 2020-09-02 | Disposition: A | Discharge status: Home / Self Care | Payer: MEDICARE | Source: Ambulatory Visit | Attending: Internal Medicine | Admitting: Internal Medicine

## 2020-09-02 ENCOUNTER — Other Ambulatory Visit: Payer: Self-pay

## 2020-09-02 DIAGNOSIS — Z01812 Encounter for preprocedural laboratory examination: Secondary | ICD-10-CM | POA: Insufficient documentation

## 2020-09-02 DIAGNOSIS — Z20822 Contact with and (suspected) exposure to covid-19: Secondary | ICD-10-CM | POA: Insufficient documentation

## 2020-09-03 LAB — SARS CORONAVIRUS 2 (TAT 6-24 HRS): SARS Coronavirus 2: NEGATIVE

## 2020-09-04 ENCOUNTER — Encounter (HOSPITAL_COMMUNITY)
Admission: RE | Disposition: A | Discharge status: Home / Self Care | Payer: Self-pay | Source: Home / Self Care | Attending: Internal Medicine

## 2020-09-04 ENCOUNTER — Encounter (HOSPITAL_COMMUNITY): Payer: Self-pay | Admitting: Internal Medicine

## 2020-09-04 ENCOUNTER — Other Ambulatory Visit: Payer: Self-pay

## 2020-09-04 ENCOUNTER — Ambulatory Visit (HOSPITAL_COMMUNITY)
Admission: RE | Admit: 2020-09-04 | Discharge: 2020-09-04 | Disposition: A | Discharge status: Home / Self Care | Payer: MEDICARE | Attending: Internal Medicine | Admitting: Internal Medicine

## 2020-09-04 DIAGNOSIS — Z1211 Encounter for screening for malignant neoplasm of colon: Secondary | ICD-10-CM | POA: Diagnosis not present

## 2020-09-04 DIAGNOSIS — K5730 Diverticulosis of large intestine without perforation or abscess without bleeding: Secondary | ICD-10-CM | POA: Insufficient documentation

## 2020-09-04 DIAGNOSIS — Z8371 Family history of colonic polyps: Secondary | ICD-10-CM | POA: Insufficient documentation

## 2020-09-04 DIAGNOSIS — K219 Gastro-esophageal reflux disease without esophagitis: Secondary | ICD-10-CM | POA: Diagnosis not present

## 2020-09-04 DIAGNOSIS — Z800 Family history of malignant neoplasm of digestive organs: Secondary | ICD-10-CM | POA: Insufficient documentation

## 2020-09-04 DIAGNOSIS — Z79899 Other long term (current) drug therapy: Secondary | ICD-10-CM | POA: Diagnosis not present

## 2020-09-04 DIAGNOSIS — Z85828 Personal history of other malignant neoplasm of skin: Secondary | ICD-10-CM | POA: Diagnosis not present

## 2020-09-04 DIAGNOSIS — Z9049 Acquired absence of other specified parts of digestive tract: Secondary | ICD-10-CM | POA: Insufficient documentation

## 2020-09-04 HISTORY — PX: COLONOSCOPY: SHX5424

## 2020-09-04 SURGERY — COLONOSCOPY
Anesthesia: Moderate Sedation

## 2020-09-04 MED ORDER — MIDAZOLAM HCL 5 MG/5ML IJ SOLN
INTRAMUSCULAR | Status: DC | PRN
  Administered 2020-09-04: 2 mg via INTRAVENOUS
  Administered 2020-09-04: 1 mg via INTRAVENOUS
  Administered 2020-09-04: 2 mg via INTRAVENOUS

## 2020-09-04 MED ORDER — SODIUM CHLORIDE 0.9 % IV SOLN
INTRAVENOUS | Status: DC
  Administered 2020-09-04: 1000 mL via INTRAVENOUS

## 2020-09-04 MED ORDER — MEPERIDINE HCL 100 MG/ML IJ SOLN
INTRAMUSCULAR | Status: DC | PRN
  Administered 2020-09-04: 25 mg via INTRAVENOUS
  Administered 2020-09-04: 15 mg via INTRAVENOUS

## 2020-09-04 MED ORDER — MIDAZOLAM HCL 5 MG/5ML IJ SOLN
INTRAMUSCULAR | Status: AC
  Filled 2020-09-04: qty 10

## 2020-09-04 MED ORDER — STERILE WATER FOR IRRIGATION IR SOLN
Status: DC | PRN
  Administered 2020-09-04: 200 mL

## 2020-09-04 MED ORDER — ONDANSETRON HCL 4 MG/2ML IJ SOLN
INTRAMUSCULAR | Status: DC | PRN
  Administered 2020-09-04: 4 mg via INTRAVENOUS

## 2020-09-04 MED ORDER — MEPERIDINE HCL 50 MG/ML IJ SOLN
INTRAMUSCULAR | Status: AC
  Filled 2020-09-04: qty 1

## 2020-09-04 MED ORDER — ONDANSETRON HCL 4 MG/2ML IJ SOLN
INTRAMUSCULAR | Status: AC
  Filled 2020-09-04: qty 2

## 2020-09-04 NOTE — Op Note (Signed)
[facility] Patient Name: [name] [name] Procedure Date: 09/04/2020 9:48 AM MRN: [id number] Date of Birth: Nov 11, 1945 Attending MD: Norvel Richards , MD CSN: [id number] Age: 75 Admit Type: Outpatient Procedure:                Colonoscopy Indications:              Screening in patient at increased risk: Family                            history of 1st-degree relative with colorectal                            cancer Providers:                Norvel Richards, MD, Lurline Del, RN, Wynonia Musty Tech, Technician Referring MD:              Medicines:                Midazolam 4 mg IV, Meperidine 40 mg IV Complications:            No immediate complications. Estimated Blood Loss:     Estimated blood loss was minimal. Procedure:                Pre-Anesthesia Assessment:                           - Prior to the procedure, a History and Physical                            was performed, and patient medications and                            allergies were reviewed. The patient's tolerance of                            previous anesthesia was also reviewed. The risks                            and benefits of the procedure and the sedation                            options and risks were discussed with the patient.                            All questions were answered, and informed consent                            was obtained. Prior Anticoagulants: The patient has                            taken no previous anticoagulant or antiplatelet  agents. ASA Grade Assessment: III - A patient with                            severe systemic disease. After reviewing the risks                            and benefits, the patient was deemed in                            satisfactory condition to undergo the procedure.                           After obtaining informed consent, the colonoscope                            was passed under  direct vision. Throughout the                            procedure, the patient's blood pressure, pulse, and                            oxygen saturations were monitored continuously. The                            CF-HQ190L ([id number]) scope was introduced through                            the anus and advanced to the the cecum, identified                            by appendiceal orifice and ileocecal valve. The                            colonoscopy was performed without difficulty. The                            patient tolerated the procedure well. The ileocecal                            valve, appendiceal orifice, and rectum were                            photographed. Scope In: 10:08:35 AM Scope Out: 10:23:13 AM Scope Withdrawal Time: 0 hours 10 minutes 10 seconds  Total Procedure Duration: 0 hours 14 minutes 38 seconds  Findings:      The perianal and digital rectal examinations were normal.      Scattered medium-mouthed diverticula were found in the sigmoid colon and       descending colon.      The exam was otherwise without abnormality on direct and retroflexion       views. Impression:               - Diverticulosis in the sigmoid colon and in the  descending colon.                           - The examination was otherwise normal on direct                            and retroflexion views.                           - No specimens collected. Moderate Sedation:      Moderate (conscious) sedation was personally administered by an       anesthesia professional. The following parameters were monitored: oxygen       saturation, heart rate, blood pressure, respiratory rate, EKG, adequacy       of pulmonary ventilation, and response to care.      Moderate (conscious) sedation was personally administered by an       anesthesia professional. The following parameters were monitored: oxygen       saturation, heart rate, blood pressure, respiratory rate, EKG,  adequacy       of pulmonary ventilation, and response to care. Recommendation:           - Patient has a contact number available for                            emergencies. The signs and symptoms of potential                            delayed complications were discussed with the                            patient. Return to normal activities tomorrow.                            Written discharge instructions were provided to the                            patient.                           - Advance diet as tolerated.                           - No repeat colonoscopy due to age.                           - Return to GI clinic (date not yet determined). Procedure Code(s):        --- Professional ---                           [phone], Colonoscopy, flexible; diagnostic, including                            collection of specimen(s) by brushing or washing,                            when performed (separate procedure) Diagnosis Code(s):        ---  Professional ---                           Z80.0, Family history of malignant neoplasm of                            digestive organs                           K57.30, Diverticulosis of large intestine without                            perforation or abscess without bleeding CPT copyright 2019 [employer]. All rights reserved. The codes documented in this report are preliminary and upon coder review may  be revised to meet current compliance requirements. Cristopher Estimable. Lavera Vandermeer, MD Norvel Richards, MD 09/04/2020 10:32:06 AM This report has been signed electronically. Number of Addenda: 0

## 2020-09-04 NOTE — Interval H&P Note (Signed)
History and Physical Interval Note:  09/04/2020 9:51 AM  [name] [name]  has presented today for surgery, with the diagnosis of family history of colon cancer, history of colon polyps.  The various methods of treatment have been discussed with the patient and family. After consideration of risks, benefits and other options for treatment, the patient has consented to  Procedure(s) with comments: COLONOSCOPY (N/A) - PM as a surgical intervention.  The patient's history has been reviewed, patient examined, no change in status, stable for surgery.  I have reviewed the patient's chart and labs.  Questions were answered to the patient's satisfaction.     [name] [name]  No change.  Patient here for high risk screening colonoscopy.  The risks, benefits, limitations, alternatives and imponderables have been reviewed with the patient. Questions have been answered. All parties are agreeable.

## 2020-09-04 NOTE — Discharge Instructions (Signed)
Hemorrhoids Hemorrhoids are swollen veins that may develop:  In the butt (rectum). These are called internal hemorrhoids.  Around the opening of the butt (anus). These are called external hemorrhoids. Hemorrhoids can cause pain, itching, or bleeding. Most of the time, they do not cause serious problems. They usually get better with diet changes, lifestyle changes, and other home treatments. What are the causes? This condition may be caused by:  Having trouble pooping (constipation).  Pushing hard (straining) to poop.  Watery poop (diarrhea).  Pregnancy.  Being very overweight (obese).  Sitting for long periods of time.  Heavy lifting or other activity that causes you to strain.  Anal sex.  Riding a bike for a long period of time. What are the signs or symptoms? Symptoms of this condition include:  Pain.  Itching or soreness in the butt.  Bleeding from the butt.  Leaking poop.  Swelling in the area.  One or more lumps around the opening of your butt. How is this diagnosed? A doctor can often diagnose this condition by looking at the affected area. The doctor may also:  Do an exam that involves feeling the area with a gloved hand (digital rectal exam).  Examine the area inside your butt using a small tube (anoscope).  Order blood tests. This may be done if you have lost a lot of blood.  Have you get a test that involves looking inside the colon using a flexible tube with a camera on the end (sigmoidoscopy or colonoscopy). How is this treated? This condition can usually be treated at home. Your doctor may tell you to change what you eat, make lifestyle changes, or try home treatments. If these do not help, procedures can be done to remove the hemorrhoids or make them smaller. These may involve:  Placing rubber bands at the base of the hemorrhoids to cut off their blood supply.  Injecting medicine into the hemorrhoids to shrink them.  Shining a type of light  energy onto the hemorrhoids to cause them to fall off.  Doing surgery to remove the hemorrhoids or cut off their blood supply. Follow these instructions at home: Eating and drinking  Eat foods that have a lot of fiber in them. These include whole grains, beans, nuts, fruits, and vegetables.  Ask your doctor about taking products that have added fiber (fibersupplements).  Reduce the amount of fat in your diet. You can do this by: ? Eating low-fat dairy products. ? Eating less red meat. ? Avoiding processed foods.  Drink enough fluid to keep your pee (urine) pale yellow.   Managing pain and swelling  Take a warm-water bath (sitz bath) for 20 minutes to ease pain. Do this 3-4 times a day. You may do this in a bathtub or using a portable sitz bath that fits over the toilet.  If told, put ice on the painful area. It may be helpful to use ice between your warm baths. ? Put ice in a plastic bag. ? Place a towel between your skin and the bag. ? Leave the ice on for 20 minutes, 2-3 times a day.   General instructions  Take over-the-counter and prescription medicines only as told by your doctor. ? Medicated creams and medicines may be used as told.  Exercise often. Ask your doctor how much and what kind of exercise is best for you.  Go to the bathroom when you have the urge to poop. Do not wait.  Avoid pushing too hard when you poop.  Keep your butt dry and clean. Use wet toilet paper or moist towelettes after pooping.  Do not sit on the toilet for a long time.  Keep all follow-up visits as told by your doctor. This is important. Contact a doctor if you:  Have pain and swelling that do not get better with treatment or medicine.  Have trouble pooping.  Cannot poop.  Have pain or swelling outside the area of the hemorrhoids. Get help right away if you have:  Bleeding that will not stop. Summary  Hemorrhoids are swollen veins in the butt or around the opening of the  butt.  They can cause pain, itching, or bleeding.  Eat foods that have a lot of fiber in them. These include whole grains, beans, nuts, fruits, and vegetables.  Take a warm-water bath (sitz bath) for 20 minutes to ease pain. Do this 3-4 times a day. This information is not intended to replace advice given to you by your health care provider. Make sure you discuss any questions you have with your health care provider. Document Revised: 04/28/2018 Document Reviewed: 09/09/2017 Elsevier Patient Education  2021 [place]. Diverticulosis  Diverticulosis is a condition that develops when small pouches (diverticula) form in the wall of the large intestine (colon). The colon is where water is absorbed and stool (feces) is formed. The pouches form when the inside layer of the colon pushes through weak spots in the outer layers of the colon. You may have a few pouches or many of them. The pouches usually do not cause problems unless they become inflamed or infected. When this happens, the condition is called diverticulitis. What are the causes? The cause of this condition is not known. What increases the risk? The following factors may make you more likely to develop this condition:  Being older than age 60. Your risk for this condition increases with age. Diverticulosis is rare among people younger than age 29. By age 18, many people have it.  Eating a low-fiber diet.  Having frequent constipation.  Being overweight.  Not getting enough exercise.  Smoking.  Taking over-the-counter pain medicines, like aspirin and ibuprofen.  Having a family history of diverticulosis. What are the signs or symptoms? In most people, there are no symptoms of this condition. If you do have symptoms, they may include:  Bloating.  Cramps in the abdomen.  Constipation or diarrhea.  Pain in the lower left side of the abdomen. How is this diagnosed? Because diverticulosis usually has no symptoms, it is  most often diagnosed during an exam for other colon problems. The condition may be diagnosed by:  Using a flexible scope to examine the colon (colonoscopy).  Taking an X-ray of the colon after dye has been put into the colon (barium enema).  Having a CT scan. How is this treated? You may not need treatment for this condition. Your health care provider may recommend treatment to prevent problems. You may need treatment if you have symptoms or if you previously had diverticulitis. Treatment may include:  Eating a high-fiber diet.  Taking a fiber supplement.  Taking a live bacteria supplement (probiotic).  Taking medicine to relax your colon.   Follow these instructions at home: Medicines  Take over-the-counter and prescription medicines only as told by your health care provider.  If told by your health care provider, take a fiber supplement or probiotic. Constipation prevention Your condition may cause constipation. To prevent or treat constipation, you may need to:  Drink enough fluid to keep  your urine pale yellow.  Take over-the-counter or prescription medicines.  Eat foods that are high in fiber, such as beans, whole grains, and fresh fruits and vegetables.  Limit foods that are high in fat and processed sugars, such as fried or sweet foods.   General instructions  Try not to strain when you have a bowel movement.  Keep all follow-up visits as told by your health care provider. This is important. Contact a health care provider if you:  Have pain in your abdomen.  Have bloating.  Have cramps.  Have not had a bowel movement in 3 days. Get help right away if:  Your pain gets worse.  Your bloating becomes very bad.  You have a fever or chills, and your symptoms suddenly get worse.  You vomit.  You have bowel movements that are bloody or black.  You have bleeding from your rectum. Summary  Diverticulosis is a condition that develops when small pouches  (diverticula) form in the wall of the large intestine (colon).  You may have a few pouches or many of them.  This condition is most often diagnosed during an exam for other colon problems.  Treatment may include increasing the fiber in your diet, taking supplements, or taking medicines. This information is not intended to replace advice given to you by your health care provider. Make sure you discuss any questions you have with your health care provider. Document Revised: 11/17/2018 Document Reviewed: 11/17/2018 Elsevier Patient Education  2021 [place].  Colonoscopy Discharge Instructions  Read the instructions outlined below and refer to this sheet in the next few weeks. These discharge instructions provide you with general information on caring for yourself after you leave the hospital. Your doctor may also give you specific instructions. While your treatment has been planned according to the most current medical practices available, unavoidable complications occasionally occur. If you have any problems or questions after discharge, call Dr. Gala Romney at [phone]. ACTIVITY  You may resume your regular activity, but move at a slower pace for the next 24 hours.   Take frequent rest periods for the next 24 hours.   Walking will help get rid of the air and reduce the bloated feeling in your belly (abdomen).   No driving for 24 hours (because of the medicine (anesthesia) used during the test).    Do not sign any important legal documents or operate any machinery for 24 hours (because of the anesthesia used during the test).  NUTRITION  Drink plenty of fluids.   You may resume your normal diet as instructed by your doctor.   Begin with a light meal and progress to your normal diet. Heavy or fried foods are harder to digest and may make you feel sick to your stomach (nauseated).   Avoid alcoholic beverages for 24 hours or as instructed.  MEDICATIONS  You may resume your normal  medications unless your doctor tells you otherwise.  WHAT YOU CAN EXPECT TODAY  Some feelings of bloating in the abdomen.   Passage of more gas than usual.   Spotting of blood in your stool or on the toilet paper.  IF YOU HAD POLYPS REMOVED DURING THE COLONOSCOPY:  No aspirin products for 7 days or as instructed.   No alcohol for 7 days or as instructed.   Eat a soft diet for the next 24 hours.  FINDING OUT THE RESULTS OF YOUR TEST Not all test results are available during your visit. If your test results are not  back during the visit, make an appointment with your caregiver to find out the results. Do not assume everything is normal if you have not heard from your caregiver or the medical facility. It is important for you to follow up on all of your test results.  SEEK IMMEDIATE MEDICAL ATTENTION IF:  You have more than a spotting of blood in your stool.   Your belly is swollen (abdominal distention).   You are nauseated or vomiting.   You have a temperature over 101.   You have abdominal pain or discomfort that is severe or gets worse throughout the day.   Diverticulosis information provided  No polyps found today.  A future colonoscopy is not recommended unless new symptoms develop  At patient request, I called wife Aeon Kessner at [phone] -reviewed findings and recommendations

## 2020-09-11 ENCOUNTER — Encounter (HOSPITAL_COMMUNITY): Payer: Self-pay | Admitting: Internal Medicine

## 2020-10-14 DIAGNOSIS — D225 Melanocytic nevi of trunk: Secondary | ICD-10-CM | POA: Diagnosis not present

## 2020-10-14 DIAGNOSIS — L821 Other seborrheic keratosis: Secondary | ICD-10-CM | POA: Diagnosis not present

## 2020-10-14 DIAGNOSIS — L723 Sebaceous cyst: Secondary | ICD-10-CM | POA: Diagnosis not present

## 2020-10-14 DIAGNOSIS — Z808 Family history of malignant neoplasm of other organs or systems: Secondary | ICD-10-CM | POA: Diagnosis not present

## 2020-10-14 DIAGNOSIS — L578 Other skin changes due to chronic exposure to nonionizing radiation: Secondary | ICD-10-CM | POA: Diagnosis not present

## 2020-10-14 DIAGNOSIS — L814 Other melanin hyperpigmentation: Secondary | ICD-10-CM | POA: Diagnosis not present

## 2020-10-14 DIAGNOSIS — L570 Actinic keratosis: Secondary | ICD-10-CM | POA: Diagnosis not present

## 2020-11-18 DIAGNOSIS — S80869A Insect bite (nonvenomous), unspecified lower leg, initial encounter: Secondary | ICD-10-CM | POA: Diagnosis not present

## 2020-12-31 DIAGNOSIS — I10 Essential (primary) hypertension: Secondary | ICD-10-CM | POA: Diagnosis not present

## 2021-01-27 DIAGNOSIS — D485 Neoplasm of uncertain behavior of skin: Secondary | ICD-10-CM | POA: Diagnosis not present

## 2021-02-17 DIAGNOSIS — Z23 Encounter for immunization: Secondary | ICD-10-CM | POA: Diagnosis not present

## 2021-03-03 DIAGNOSIS — I10 Essential (primary) hypertension: Secondary | ICD-10-CM | POA: Diagnosis not present

## 2021-03-03 DIAGNOSIS — E785 Hyperlipidemia, unspecified: Secondary | ICD-10-CM | POA: Diagnosis not present

## 2021-03-03 DIAGNOSIS — K219 Gastro-esophageal reflux disease without esophagitis: Secondary | ICD-10-CM | POA: Diagnosis not present

## 2021-04-16 DIAGNOSIS — Z808 Family history of malignant neoplasm of other organs or systems: Secondary | ICD-10-CM | POA: Diagnosis not present

## 2021-04-16 DIAGNOSIS — Z23 Encounter for immunization: Secondary | ICD-10-CM | POA: Diagnosis not present

## 2021-04-16 DIAGNOSIS — L578 Other skin changes due to chronic exposure to nonionizing radiation: Secondary | ICD-10-CM | POA: Diagnosis not present

## 2021-04-16 DIAGNOSIS — D225 Melanocytic nevi of trunk: Secondary | ICD-10-CM | POA: Diagnosis not present

## 2021-04-16 DIAGNOSIS — L821 Other seborrheic keratosis: Secondary | ICD-10-CM | POA: Diagnosis not present

## 2021-04-16 DIAGNOSIS — L814 Other melanin hyperpigmentation: Secondary | ICD-10-CM | POA: Diagnosis not present

## 2021-05-21 DIAGNOSIS — K4000 Bilateral inguinal hernia, with obstruction, without gangrene, not specified as recurrent: Secondary | ICD-10-CM | POA: Diagnosis not present

## 2021-06-01 DIAGNOSIS — I10 Essential (primary) hypertension: Secondary | ICD-10-CM | POA: Diagnosis not present

## 2021-06-01 DIAGNOSIS — E785 Hyperlipidemia, unspecified: Secondary | ICD-10-CM | POA: Diagnosis not present

## 2021-06-07 ENCOUNTER — Ambulatory Visit
Admission: EM | Admit: 2021-06-07 | Discharge: 2021-06-07 | Disposition: A | Discharge status: Home / Self Care | Payer: MEDICARE | Attending: Urgent Care | Admitting: Urgent Care

## 2021-06-07 ENCOUNTER — Encounter: Payer: Self-pay | Admitting: Emergency Medicine

## 2021-06-07 ENCOUNTER — Ambulatory Visit (INDEPENDENT_AMBULATORY_CARE_PROVIDER_SITE_OTHER): Payer: MEDICARE

## 2021-06-07 ENCOUNTER — Other Ambulatory Visit: Payer: Self-pay

## 2021-06-07 DIAGNOSIS — R059 Cough, unspecified: Secondary | ICD-10-CM | POA: Diagnosis not present

## 2021-06-07 DIAGNOSIS — R519 Headache, unspecified: Secondary | ICD-10-CM | POA: Diagnosis not present

## 2021-06-07 DIAGNOSIS — R058 Other specified cough: Secondary | ICD-10-CM

## 2021-06-07 DIAGNOSIS — R0981 Nasal congestion: Secondary | ICD-10-CM | POA: Diagnosis not present

## 2021-06-07 DIAGNOSIS — J069 Acute upper respiratory infection, unspecified: Secondary | ICD-10-CM | POA: Diagnosis not present

## 2021-06-07 DIAGNOSIS — Z87891 Personal history of nicotine dependence: Secondary | ICD-10-CM

## 2021-06-07 MED ORDER — CETIRIZINE HCL 10 MG PO TABS: 10.0000 mg | ORAL_TABLET | Freq: Every day | ORAL | 0 refills | Status: AC

## 2021-06-07 MED ORDER — BENZONATATE 100 MG PO CAPS: 100.0000 mg | ORAL_CAPSULE | Freq: Three times a day (TID) | ORAL | 0 refills | Status: AC | PRN

## 2021-06-07 MED ORDER — PROMETHAZINE-DM 6.25-15 MG/5ML PO SYRP: 5.0000 mL | ORAL_SOLUTION | Freq: Every evening | ORAL | 0 refills | Status: AC | PRN

## 2021-06-07 MED ORDER — IPRATROPIUM BROMIDE 0.03 % NA SOLN: 2.0000 | Freq: Two times a day (BID) | NASAL | 0 refills | Status: AC

## 2021-06-07 NOTE — Discharge Instructions (Addendum)
We will manage this as a viral respiratory illness. For sore throat or cough try using a honey-based tea. Use 3 teaspoons of honey with juice squeezed from half lemon. Place shaved pieces of ginger into 1/2-1 cup of water and warm over stove top. Then mix the ingredients and repeat every 4 hours as needed. Please take Tylenol 500mg -650mg  once every 6 hours for fevers, aches and pains. Hydrate very well with at least 2 liters (64 ounces) of water. Eat light meals such as soups (chicken and noodles, chicken wild rice, vegetable).  Do not eat any foods that you are allergic to.  Start an antihistamine like Zyrtec for postnasal drainage, sinus congestion.  You can take this together with the nasal spray and cough medications.

## 2021-06-07 NOTE — ED Provider Notes (Signed)
[facility]   MRN: [id number] DOB: 1945/10/28  Subjective:   [name] [name] is a 76 y.o. male presenting for 5-day history of acute onset persistent runny and stuffy nose, sinus headaches, coughing that just became productive today.  Has felt some congestion in his chest.  Took a COVID test and was negative.  No chest pain, shortness of breath or wheezing.  No history of asthma or COPD.  Patient is a former smoker.  Quit about 30 years ago, has a 30-pack-year history.  No current facility-administered medications for this encounter.  Current Outpatient Medications:    acetaminophen (TYLENOL) 325 MG tablet, Take 650 mg by mouth every 6 (six) hours as needed for moderate pain or headache., Disp: , Rfl:    Coenzyme Q10 (CO Q 10) 100 MG CAPS, Take 100 mg by mouth daily., Disp: , Rfl:    diphenhydrAMINE (BENADRYL) 25 MG tablet, Take 25 mg by mouth every 6 (six) hours as needed for allergies., Disp: , Rfl:    losartan-hydrochlorothiazide (HYZAAR) 100-25 MG per tablet, Take 1 tablet by mouth daily., Disp: , Rfl:    lovastatin (MEVACOR) 20 MG tablet, Take 20 mg by mouth at bedtime., Disp: , Rfl:    Multiple Vitamin (MULTIVITAMIN WITH MINERALS) TABS, Take 1 tablet by mouth daily., Disp: , Rfl:    omeprazole (PRILOSEC) 20 MG capsule, Take 20 mg by mouth daily., Disp: , Rfl:    polyethylene glycol-electrolytes (TRILYTE) 420 g solution, Take 4,000 mLs by mouth as directed., Disp: 4000 mL, Rfl: 0   Saw Palmetto 450 MG CAPS, Take 450 mg by mouth daily., Disp: , Rfl:    sildenafil (VIAGRA) 25 MG tablet, Take 25 mg daily as needed by mouth for erectile dysfunction., Disp: , Rfl:    No Known Allergies  Past Medical History:  Diagnosis Date   Frequent headaches    GERD (gastroesophageal reflux disease)    Hyperlipidemia    Hypertension    Skin cancer    Squamous, basal-multiple     Past Surgical History:  Procedure Laterality Date   APPENDECTOMY  1964   CEREBRAL ANEURYSM REPAIR      age 30   CHOLECYSTECTOMY     COLONOSCOPY  06/28/2007   EXB:MWUXLK rectum, left-sided diverticula.  Remainder of colonic mucosa appeared normal, suboptimal prep of ascending segment made exam more difficult in this area.   COLONOSCOPY N/A 11/18/2012   Procedure: COLONOSCOPY;  Surgeon: Daneil Dolin, MD;  Location: [facility];  Service: Endoscopy;  Laterality: N/A;  8:15   COLONOSCOPY N/A 09/04/2020   Procedure: COLONOSCOPY;  Surgeon: Daneil Dolin, MD;  Location: [facility];  Service: Endoscopy;  Laterality: N/A;  PM   ERCP N/A 03/16/2017   Procedure: ENDOSCOPIC RETROGRADE CHOLANGIOPANCREATOGRAPHY (ERCP);  Surgeon: Daneil Dolin, MD;  Location: [facility];  Service: Endoscopy;  Laterality: N/A;   ESOPHAGOGASTRODUODENOSCOPY N/A 11/18/2012   Procedure: ESOPHAGOGASTRODUODENOSCOPY (EGD);  Surgeon: Daneil Dolin, MD;  Location: [facility];  Service: Endoscopy;  Laterality: N/A;   GASTROINTESTINAL STENT REMOVAL N/A 03/16/2017   Procedure: GASTROINTESTINAL STENT REMOVAL;  Surgeon: Daneil Dolin, MD;  Location: [facility];  Service: Endoscopy;  Laterality: N/A;    Family History  Problem Relation Age of Onset   Colon cancer Mother        58    Social History   Tobacco Use   Smoking status: Former   Smokeless tobacco: Never  Scientific laboratory technician Use: Never  used  Substance Use Topics   Alcohol use: Not Currently    Comment: rare   Drug use: No    ROS   Objective:   Vitals: BP (!) 153/85 (BP Location: Right Arm)    Pulse 78    Temp 98.3 F (36.8 C) (Oral)    Resp 18    SpO2 92%   Pulse oximetry 94-96% on recheck.   Physical Exam Constitutional:      General: He is not in acute distress.    Appearance: Normal appearance. He is well-developed. He is not ill-appearing, toxic-appearing or diaphoretic.  HENT:     Head: Normocephalic and atraumatic.     Right Ear: External ear normal.     Left Ear: External ear normal.     Nose: Nose normal.      Mouth/Throat:     Mouth: Mucous membranes are moist.  Eyes:     General: No scleral icterus.       Right eye: No discharge.        Left eye: No discharge.     Extraocular Movements: Extraocular movements intact.  Cardiovascular:     Rate and Rhythm: Normal rate and regular rhythm.     Heart sounds: Normal heart sounds. No murmur heard.   No friction rub. No gallop.  Pulmonary:     Effort: Pulmonary effort is normal. No respiratory distress.     Breath sounds: Normal breath sounds. No stridor. No wheezing, rhonchi or rales.  Neurological:     Mental Status: He is alert and oriented to person, place, and time.  Psychiatric:        Mood and Affect: Mood normal.        Behavior: Behavior normal.        Thought Content: Thought content normal.    DG Chest 2 View  Result Date: 06/07/2021 CLINICAL DATA:  Cough for 1 week. EXAM: CHEST - 2 VIEW COMPARISON:  07/17/2004. FINDINGS: Lungs are hyperexpanded. The lungs are clear without focal pneumonia, edema, pneumothorax or pleural effusion. Right paratracheal opacity is stable, likely related to ectasia of arch vessel anatomy. The cardiopericardial silhouette is within normal limits for size. The visualized bony structures of the thorax show no acute abnormality. IMPRESSION: Hyperexpansion without acute cardiopulmonary findings. Electronically Signed   By: Misty Stanley M.D.   On: 06/07/2021 10:27     Assessment and Plan :   PDMP not reviewed this encounter.  1. Viral URI with cough   2. Sinus headache   3. Sinus congestion   4. Productive cough   5. Former smoker    Deferred COVID testing given the timeline of his illness and the fact that patient tested negative with a home test.  He also did not want a repeat.  Recommend supportive care for viral upper respiratory infection. Counseled patient on potential for adverse effects with medications prescribed/recommended today, ER and return-to-clinic precautions discussed, patient verbalized  understanding.    Jaynee Eagles, PA-C 06/07/21 1037

## 2021-06-07 NOTE — ED Triage Notes (Signed)
Cough, runny nose and headache since Tuesday.  Home covid test this morning was negative.

## 2021-06-16 DIAGNOSIS — K4090 Unilateral inguinal hernia, without obstruction or gangrene, not specified as recurrent: Secondary | ICD-10-CM | POA: Diagnosis not present

## 2021-06-27 DIAGNOSIS — Z125 Encounter for screening for malignant neoplasm of prostate: Secondary | ICD-10-CM | POA: Diagnosis not present

## 2021-06-27 DIAGNOSIS — I10 Essential (primary) hypertension: Secondary | ICD-10-CM | POA: Diagnosis not present

## 2021-06-27 DIAGNOSIS — E785 Hyperlipidemia, unspecified: Secondary | ICD-10-CM | POA: Diagnosis not present

## 2021-06-27 DIAGNOSIS — Z79899 Other long term (current) drug therapy: Secondary | ICD-10-CM | POA: Diagnosis not present

## 2021-06-27 DIAGNOSIS — K219 Gastro-esophageal reflux disease without esophagitis: Secondary | ICD-10-CM | POA: Diagnosis not present

## 2021-06-27 DIAGNOSIS — R7301 Impaired fasting glucose: Secondary | ICD-10-CM | POA: Diagnosis not present

## 2021-07-04 DIAGNOSIS — I10 Essential (primary) hypertension: Secondary | ICD-10-CM | POA: Diagnosis not present

## 2021-07-04 DIAGNOSIS — E785 Hyperlipidemia, unspecified: Secondary | ICD-10-CM | POA: Diagnosis not present

## 2021-07-04 DIAGNOSIS — K2100 Gastro-esophageal reflux disease with esophagitis, without bleeding: Secondary | ICD-10-CM | POA: Diagnosis not present

## 2021-07-04 DIAGNOSIS — I493 Ventricular premature depolarization: Secondary | ICD-10-CM | POA: Diagnosis not present

## 2021-07-04 DIAGNOSIS — Z6825 Body mass index (BMI) 25.0-25.9, adult: Secondary | ICD-10-CM | POA: Diagnosis not present

## 2021-07-17 DIAGNOSIS — K4090 Unilateral inguinal hernia, without obstruction or gangrene, not specified as recurrent: Secondary | ICD-10-CM | POA: Diagnosis not present

## 2021-07-17 DIAGNOSIS — G8918 Other acute postprocedural pain: Secondary | ICD-10-CM | POA: Diagnosis not present

## 2021-09-17 DIAGNOSIS — Z808 Family history of malignant neoplasm of other organs or systems: Secondary | ICD-10-CM | POA: Diagnosis not present

## 2021-09-17 DIAGNOSIS — L821 Other seborrheic keratosis: Secondary | ICD-10-CM | POA: Diagnosis not present

## 2021-09-17 DIAGNOSIS — L814 Other melanin hyperpigmentation: Secondary | ICD-10-CM | POA: Diagnosis not present

## 2021-09-17 DIAGNOSIS — L578 Other skin changes due to chronic exposure to nonionizing radiation: Secondary | ICD-10-CM | POA: Diagnosis not present

## 2021-09-17 DIAGNOSIS — D225 Melanocytic nevi of trunk: Secondary | ICD-10-CM | POA: Diagnosis not present

## 2021-09-17 DIAGNOSIS — L570 Actinic keratosis: Secondary | ICD-10-CM | POA: Diagnosis not present

## 2022-01-06 DIAGNOSIS — R972 Elevated prostate specific antigen [PSA]: Secondary | ICD-10-CM | POA: Diagnosis not present

## 2022-01-13 DIAGNOSIS — Z6825 Body mass index (BMI) 25.0-25.9, adult: Secondary | ICD-10-CM | POA: Diagnosis not present

## 2022-01-13 DIAGNOSIS — R972 Elevated prostate specific antigen [PSA]: Secondary | ICD-10-CM | POA: Diagnosis not present

## 2022-01-13 DIAGNOSIS — U071 COVID-19: Secondary | ICD-10-CM | POA: Diagnosis not present

## 2022-02-16 DIAGNOSIS — L570 Actinic keratosis: Secondary | ICD-10-CM | POA: Diagnosis not present

## 2022-02-21 DIAGNOSIS — Z23 Encounter for immunization: Secondary | ICD-10-CM | POA: Diagnosis not present

## 2022-04-29 DIAGNOSIS — L570 Actinic keratosis: Secondary | ICD-10-CM | POA: Diagnosis not present

## 2022-05-13 DIAGNOSIS — L821 Other seborrheic keratosis: Secondary | ICD-10-CM | POA: Diagnosis not present

## 2022-05-13 DIAGNOSIS — L570 Actinic keratosis: Secondary | ICD-10-CM | POA: Diagnosis not present

## 2022-05-13 DIAGNOSIS — D225 Melanocytic nevi of trunk: Secondary | ICD-10-CM | POA: Diagnosis not present

## 2022-05-13 DIAGNOSIS — L814 Other melanin hyperpigmentation: Secondary | ICD-10-CM | POA: Diagnosis not present

## 2022-05-13 DIAGNOSIS — Z808 Family history of malignant neoplasm of other organs or systems: Secondary | ICD-10-CM | POA: Diagnosis not present

## 2022-05-13 DIAGNOSIS — L578 Other skin changes due to chronic exposure to nonionizing radiation: Secondary | ICD-10-CM | POA: Diagnosis not present

## 2022-07-07 DIAGNOSIS — N529 Male erectile dysfunction, unspecified: Secondary | ICD-10-CM | POA: Diagnosis not present

## 2022-07-07 DIAGNOSIS — I10 Essential (primary) hypertension: Secondary | ICD-10-CM | POA: Diagnosis not present

## 2022-07-07 DIAGNOSIS — Z125 Encounter for screening for malignant neoplasm of prostate: Secondary | ICD-10-CM | POA: Diagnosis not present

## 2022-07-07 DIAGNOSIS — E785 Hyperlipidemia, unspecified: Secondary | ICD-10-CM | POA: Diagnosis not present

## 2022-07-07 DIAGNOSIS — R972 Elevated prostate specific antigen [PSA]: Secondary | ICD-10-CM | POA: Diagnosis not present

## 2022-07-07 DIAGNOSIS — K219 Gastro-esophageal reflux disease without esophagitis: Secondary | ICD-10-CM | POA: Diagnosis not present

## 2022-07-07 DIAGNOSIS — Z8679 Personal history of other diseases of the circulatory system: Secondary | ICD-10-CM | POA: Diagnosis not present

## 2022-07-14 DIAGNOSIS — I10 Essential (primary) hypertension: Secondary | ICD-10-CM | POA: Diagnosis not present

## 2022-07-14 DIAGNOSIS — I493 Ventricular premature depolarization: Secondary | ICD-10-CM | POA: Diagnosis not present

## 2022-07-14 DIAGNOSIS — E785 Hyperlipidemia, unspecified: Secondary | ICD-10-CM | POA: Diagnosis not present

## 2022-07-14 DIAGNOSIS — K219 Gastro-esophageal reflux disease without esophagitis: Secondary | ICD-10-CM | POA: Diagnosis not present

## 2022-09-08 DIAGNOSIS — D0439 Carcinoma in situ of skin of other parts of face: Secondary | ICD-10-CM | POA: Diagnosis not present

## 2022-09-08 DIAGNOSIS — D485 Neoplasm of uncertain behavior of skin: Secondary | ICD-10-CM | POA: Diagnosis not present

## 2022-09-08 DIAGNOSIS — C4442 Squamous cell carcinoma of skin of scalp and neck: Secondary | ICD-10-CM | POA: Diagnosis not present

## 2022-09-16 DIAGNOSIS — C4442 Squamous cell carcinoma of skin of scalp and neck: Secondary | ICD-10-CM | POA: Diagnosis not present

## 2022-10-19 DIAGNOSIS — Z5189 Encounter for other specified aftercare: Secondary | ICD-10-CM | POA: Diagnosis not present

## 2022-11-11 DIAGNOSIS — D0439 Carcinoma in situ of skin of other parts of face: Secondary | ICD-10-CM | POA: Diagnosis not present

## 2023-01-13 DIAGNOSIS — I10 Essential (primary) hypertension: Secondary | ICD-10-CM | POA: Diagnosis not present

## 2023-02-09 DIAGNOSIS — Z23 Encounter for immunization: Secondary | ICD-10-CM | POA: Diagnosis not present

## 2023-03-23 DIAGNOSIS — D485 Neoplasm of uncertain behavior of skin: Secondary | ICD-10-CM | POA: Diagnosis not present

## 2023-03-23 DIAGNOSIS — L570 Actinic keratosis: Secondary | ICD-10-CM | POA: Diagnosis not present

## 2023-03-23 DIAGNOSIS — D0439 Carcinoma in situ of skin of other parts of face: Secondary | ICD-10-CM | POA: Diagnosis not present

## 2023-05-10 DIAGNOSIS — C44329 Squamous cell carcinoma of skin of other parts of face: Secondary | ICD-10-CM | POA: Diagnosis not present

## 2023-05-22 ENCOUNTER — Ambulatory Visit
Admission: EM | Admit: 2023-05-22 | Discharge: 2023-05-22 | Disposition: A | Discharge status: Home / Self Care | Payer: MEDICARE | Attending: Nurse Practitioner | Admitting: Nurse Practitioner

## 2023-05-22 DIAGNOSIS — J069 Acute upper respiratory infection, unspecified: Secondary | ICD-10-CM | POA: Diagnosis not present

## 2023-05-22 LAB — POC COVID19/FLU A&B COMBO
Covid Antigen, POC: NEGATIVE
Influenza A Antigen, POC: NEGATIVE
Influenza B Antigen, POC: NEGATIVE

## 2023-05-22 NOTE — ED Provider Notes (Signed)
[facility]    CSN: [id number] Arrival date & time: 05/22/23  4696      History   Chief Complaint No chief complaint on file.   HPI [name] [name] is a 78 y.o. male.   The history is provided by the patient.   Patient presents for complaints of nasal congestion and runny nose that started this morning.  Patient denies fever, chills, body aches, fatigue, headaches, cough, chest pain, abdominal pain, nausea, vomiting, diarrhea, or rash.  Patient thinks symptoms started after he had come in to contact with his granddaughter who goes to daycare.  He has not taken any medication for his symptoms. Past Medical History:  Diagnosis Date   Frequent headaches    GERD (gastroesophageal reflux disease)    Hyperlipidemia    Hypertension    Skin cancer    Squamous, basal-multiple    Patient Active Problem List   Diagnosis Date Noted   FH: colon cancer 10/31/2012   GERD (gastroesophageal reflux disease) 10/31/2012    Past Surgical History:  Procedure Laterality Date   APPENDECTOMY  1964   CEREBRAL ANEURYSM REPAIR     age 60   CHOLECYSTECTOMY     COLONOSCOPY  06/28/2007   EXB:MWUXLK rectum, left-sided diverticula.  Remainder of colonic mucosa appeared normal, suboptimal prep of ascending segment made exam more difficult in this area.   COLONOSCOPY N/A 11/18/2012   Procedure: COLONOSCOPY;  Surgeon: Corbin Ade, MD;  Location: [facility];  Service: Endoscopy;  Laterality: N/A;  8:15   COLONOSCOPY N/A 09/04/2020   Procedure: COLONOSCOPY;  Surgeon: Corbin Ade, MD;  Location: [facility];  Service: Endoscopy;  Laterality: N/A;  PM   ERCP N/A 03/16/2017   Procedure: ENDOSCOPIC RETROGRADE CHOLANGIOPANCREATOGRAPHY (ERCP);  Surgeon: Corbin Ade, MD;  Location: [facility];  Service: Endoscopy;  Laterality: N/A;   ESOPHAGOGASTRODUODENOSCOPY N/A 11/18/2012   Procedure: ESOPHAGOGASTRODUODENOSCOPY (EGD);  Surgeon: Corbin Ade, MD;  Location: [facility];   Service: Endoscopy;  Laterality: N/A;   GASTROINTESTINAL STENT REMOVAL N/A 03/16/2017   Procedure: GASTROINTESTINAL STENT REMOVAL;  Surgeon: Corbin Ade, MD;  Location: [facility];  Service: Endoscopy;  Laterality: N/A;       Home Medications    Prior to Admission medications   Medication Sig Start Date End Date Taking? Authorizing Provider  acetaminophen (TYLENOL) 325 MG tablet Take 650 mg by mouth every 6 (six) hours as needed for moderate pain or headache.    [provider]  benzonatate (TESSALON) 100 MG capsule Take 1-2 capsules (100-200 mg total) by mouth 3 (three) times daily as needed for cough. 06/07/21   Wallis Bamberg, PA-C  cetirizine (ZYRTEC ALLERGY) 10 MG tablet Take 1 tablet (10 mg total) by mouth daily. 06/07/21   Wallis Bamberg, PA-C  Coenzyme Q10 (CO Q 10) 100 MG CAPS Take 100 mg by mouth daily.    [provider]  diphenhydrAMINE (BENADRYL) 25 MG tablet Take 25 mg by mouth every 6 (six) hours as needed for allergies.    [provider]  ipratropium (ATROVENT) 0.03 % nasal spray Place 2 sprays into both nostrils 2 (two) times daily. 06/07/21   Wallis Bamberg, PA-C  losartan-hydrochlorothiazide (HYZAAR) 100-25 MG per tablet Take 1 tablet by mouth daily. 09/12/12   [provider]  lovastatin (MEVACOR) 20 MG tablet Take 20 mg by mouth at bedtime. 10/17/12   [provider]  Multiple Vitamin (MULTIVITAMIN WITH MINERALS) TABS Take 1 tablet by  mouth daily.    [provider]  omeprazole (PRILOSEC) 20 MG capsule Take 20 mg by mouth daily. 09/10/12   [provider]  polyethylene glycol-electrolytes (TRILYTE) 420 g solution Take 4,000 mLs by mouth as directed. 08/12/20   Corbin Ade, MD  promethazine-dextromethorphan (PROMETHAZINE-DM) 6.25-15 MG/5ML syrup Take 5 mLs by mouth at bedtime as needed for cough. 06/07/21   Wallis Bamberg, PA-C  Saw Palmetto 450 MG CAPS Take 450 mg by mouth daily.    [provider]  sildenafil  (VIAGRA) 25 MG tablet Take 25 mg daily as needed by mouth for erectile dysfunction.    [provider]    Family History Family History  Problem Relation Age of Onset   Colon cancer Mother        42    Social History Social History   Tobacco Use   Smoking status: Former   Smokeless tobacco: Never  Advertising account planner   Vaping status: Never Used  Substance Use Topics   Alcohol use: Not Currently    Comment: rare   Drug use: No     Allergies   Patient has no known allergies.   Review of Systems Review of Systems Per HPI  Physical Exam Triage Vital Signs ED Triage Vitals  Encounter Vitals Group     BP 05/22/23 0946 (!) 155/69     Systolic BP Percentile --      Diastolic BP Percentile --      Pulse Rate 05/22/23 0946 75     Resp 05/22/23 0946 18     Temp 05/22/23 0946 98.2 F (36.8 C)     Temp Source 05/22/23 0946 Oral     SpO2 05/22/23 0946 94 %     Weight --      Height --      Head Circumference --      Peak Flow --      Pain Score 05/22/23 0948 0     Pain Loc --      Pain Education --      Exclude from Growth Chart --    No data found.  Updated Vital Signs BP (!) 155/69 (BP Location: Right Arm)   Pulse 75   Temp 98.2 F (36.8 C) (Oral)   Resp 18   SpO2 94%   Visual Acuity Right Eye Distance:   Left Eye Distance:   Bilateral Distance:    Right Eye Near:   Left Eye Near:    Bilateral Near:     Physical Exam Vitals and nursing note reviewed.  Constitutional:      General: He is not in acute distress.    Appearance: Normal appearance.  HENT:     Head: Normocephalic.     Right Ear: Tympanic membrane, ear canal and external ear normal.     Left Ear: Tympanic membrane, ear canal and external ear normal.     Nose: Congestion present.     Right Turbinates: Enlarged and swollen.     Left Turbinates: Enlarged and swollen.     Right Sinus: No maxillary sinus tenderness or frontal sinus tenderness.     Left Sinus: No maxillary sinus tenderness  or frontal sinus tenderness.     Mouth/Throat:     Lips: Pink.     Mouth: Mucous membranes are moist.     Pharynx: Uvula midline. Postnasal drip present. No pharyngeal swelling, oropharyngeal exudate, posterior oropharyngeal erythema or uvula swelling.     Comments: Cobblestoning present to posterior  oropharynx  Eyes:     Extraocular Movements: Extraocular movements intact.     Conjunctiva/sclera: Conjunctivae normal.     Pupils: Pupils are equal, round, and reactive to light.  Cardiovascular:     Rate and Rhythm: Normal rate and regular rhythm.     Pulses: Normal pulses.     Heart sounds: Normal heart sounds.  Pulmonary:     Effort: Pulmonary effort is normal. No respiratory distress.     Breath sounds: Normal breath sounds. No stridor. No wheezing, rhonchi or rales.  Abdominal:     General: Bowel sounds are normal.     Palpations: Abdomen is soft.     Tenderness: There is no abdominal tenderness.  Musculoskeletal:     Cervical back: Normal range of motion.  Lymphadenopathy:     Cervical: No cervical adenopathy.  Skin:    General: Skin is warm and dry.  Neurological:     General: No focal deficit present.     Mental Status: He is alert and oriented to person, place, and time.  Psychiatric:        Mood and Affect: Mood normal.        Behavior: Behavior normal.      UC Treatments / Results  Labs (all labs ordered are listed, but only abnormal results are displayed) Labs Reviewed  POC COVID19/FLU A&B COMBO    EKG   Radiology No results found.  Procedures Procedures (including critical care time)  Medications Ordered in UC Medications - No data to display  Initial Impression / Assessment and Plan / UC Course  I have reviewed the triage vital signs and the nursing notes.  Pertinent labs & imaging results that were available during my care of the patient were reviewed by me and considered in my medical decision making (see chart for details).  COVID/flu test  was negative.  Suspect a viral URI.  Patient reports that he has over-the-counter Flonase and allergy medication to begin, which is recommended.  Supportive care recommendations were provided and discussed with the patient to include fluids, rest, normal saline nasal spray, and over-the-counter Tylenol.  Discussed indications with the patient regarding follow-up.  Patient was in agreement with this plan of care and verbalized understanding.  All questions were answered.  Patient stable for discharge.  Final Clinical Impressions(s) / UC Diagnoses   Final diagnoses:  None   Discharge Instructions   None    ED Prescriptions   None    PDMP not reviewed this encounter.   Abran Cantor, NP 05/22/23 1030

## 2023-05-22 NOTE — ED Triage Notes (Signed)
Pt reports he has head congestion and nasal congestion since this morning.

## 2023-05-22 NOTE — Discharge Instructions (Addendum)
The COVID/flu test was negative. Begin the Flonase that you have at home along with the allergy medication that you have.  Take medication as directed. May take over-the-counter Tylenol as needed for pain, fever, or general discomfort. Recommend normal saline nasal spray throughout the day for nasal congestion and runny nose. As discussed, you may develop new symptoms over the next 2 to 3 days, which is expected.  If symptoms continue to persist after 7 to 10 days or suddenly worsening, you may follow-up in this clinic or with your primary care physician for further evaluation. Follow-up as needed.

## 2023-05-26 DIAGNOSIS — J209 Acute bronchitis, unspecified: Secondary | ICD-10-CM | POA: Diagnosis not present

## 2023-05-26 DIAGNOSIS — J069 Acute upper respiratory infection, unspecified: Secondary | ICD-10-CM | POA: Diagnosis not present

## 2023-06-08 DIAGNOSIS — L814 Other melanin hyperpigmentation: Secondary | ICD-10-CM | POA: Diagnosis not present

## 2023-06-08 DIAGNOSIS — D225 Melanocytic nevi of trunk: Secondary | ICD-10-CM | POA: Diagnosis not present

## 2023-06-08 DIAGNOSIS — L578 Other skin changes due to chronic exposure to nonionizing radiation: Secondary | ICD-10-CM | POA: Diagnosis not present

## 2023-06-08 DIAGNOSIS — L570 Actinic keratosis: Secondary | ICD-10-CM | POA: Diagnosis not present

## 2023-06-08 DIAGNOSIS — Z808 Family history of malignant neoplasm of other organs or systems: Secondary | ICD-10-CM | POA: Diagnosis not present

## 2023-06-08 DIAGNOSIS — L821 Other seborrheic keratosis: Secondary | ICD-10-CM | POA: Diagnosis not present

## 2023-07-13 DIAGNOSIS — N529 Male erectile dysfunction, unspecified: Secondary | ICD-10-CM | POA: Diagnosis not present

## 2023-07-13 DIAGNOSIS — K219 Gastro-esophageal reflux disease without esophagitis: Secondary | ICD-10-CM | POA: Diagnosis not present

## 2023-07-13 DIAGNOSIS — E785 Hyperlipidemia, unspecified: Secondary | ICD-10-CM | POA: Diagnosis not present

## 2023-07-13 DIAGNOSIS — R972 Elevated prostate specific antigen [PSA]: Secondary | ICD-10-CM | POA: Diagnosis not present

## 2023-07-13 DIAGNOSIS — Z79899 Other long term (current) drug therapy: Secondary | ICD-10-CM | POA: Diagnosis not present

## 2023-07-13 DIAGNOSIS — Z86711 Personal history of pulmonary embolism: Secondary | ICD-10-CM | POA: Diagnosis not present

## 2023-07-13 DIAGNOSIS — I10 Essential (primary) hypertension: Secondary | ICD-10-CM | POA: Diagnosis not present

## 2023-07-13 DIAGNOSIS — Z125 Encounter for screening for malignant neoplasm of prostate: Secondary | ICD-10-CM | POA: Diagnosis not present

## 2023-07-20 DIAGNOSIS — I4901 Ventricular fibrillation: Secondary | ICD-10-CM | POA: Diagnosis not present

## 2023-07-20 DIAGNOSIS — I10 Essential (primary) hypertension: Secondary | ICD-10-CM | POA: Diagnosis not present

## 2023-07-20 DIAGNOSIS — E785 Hyperlipidemia, unspecified: Secondary | ICD-10-CM | POA: Diagnosis not present

## 2023-07-20 DIAGNOSIS — K219 Gastro-esophageal reflux disease without esophagitis: Secondary | ICD-10-CM | POA: Diagnosis not present

## 2023-08-17 IMAGING — DX DG CHEST 2V
2 series · 2 of 2 positions shown · non-contrast
Comparison: 07/17/2004.

CLINICAL DATA: Cough for 1 week.

EXAM:
CHEST - 2 VIEW

[chest pa]
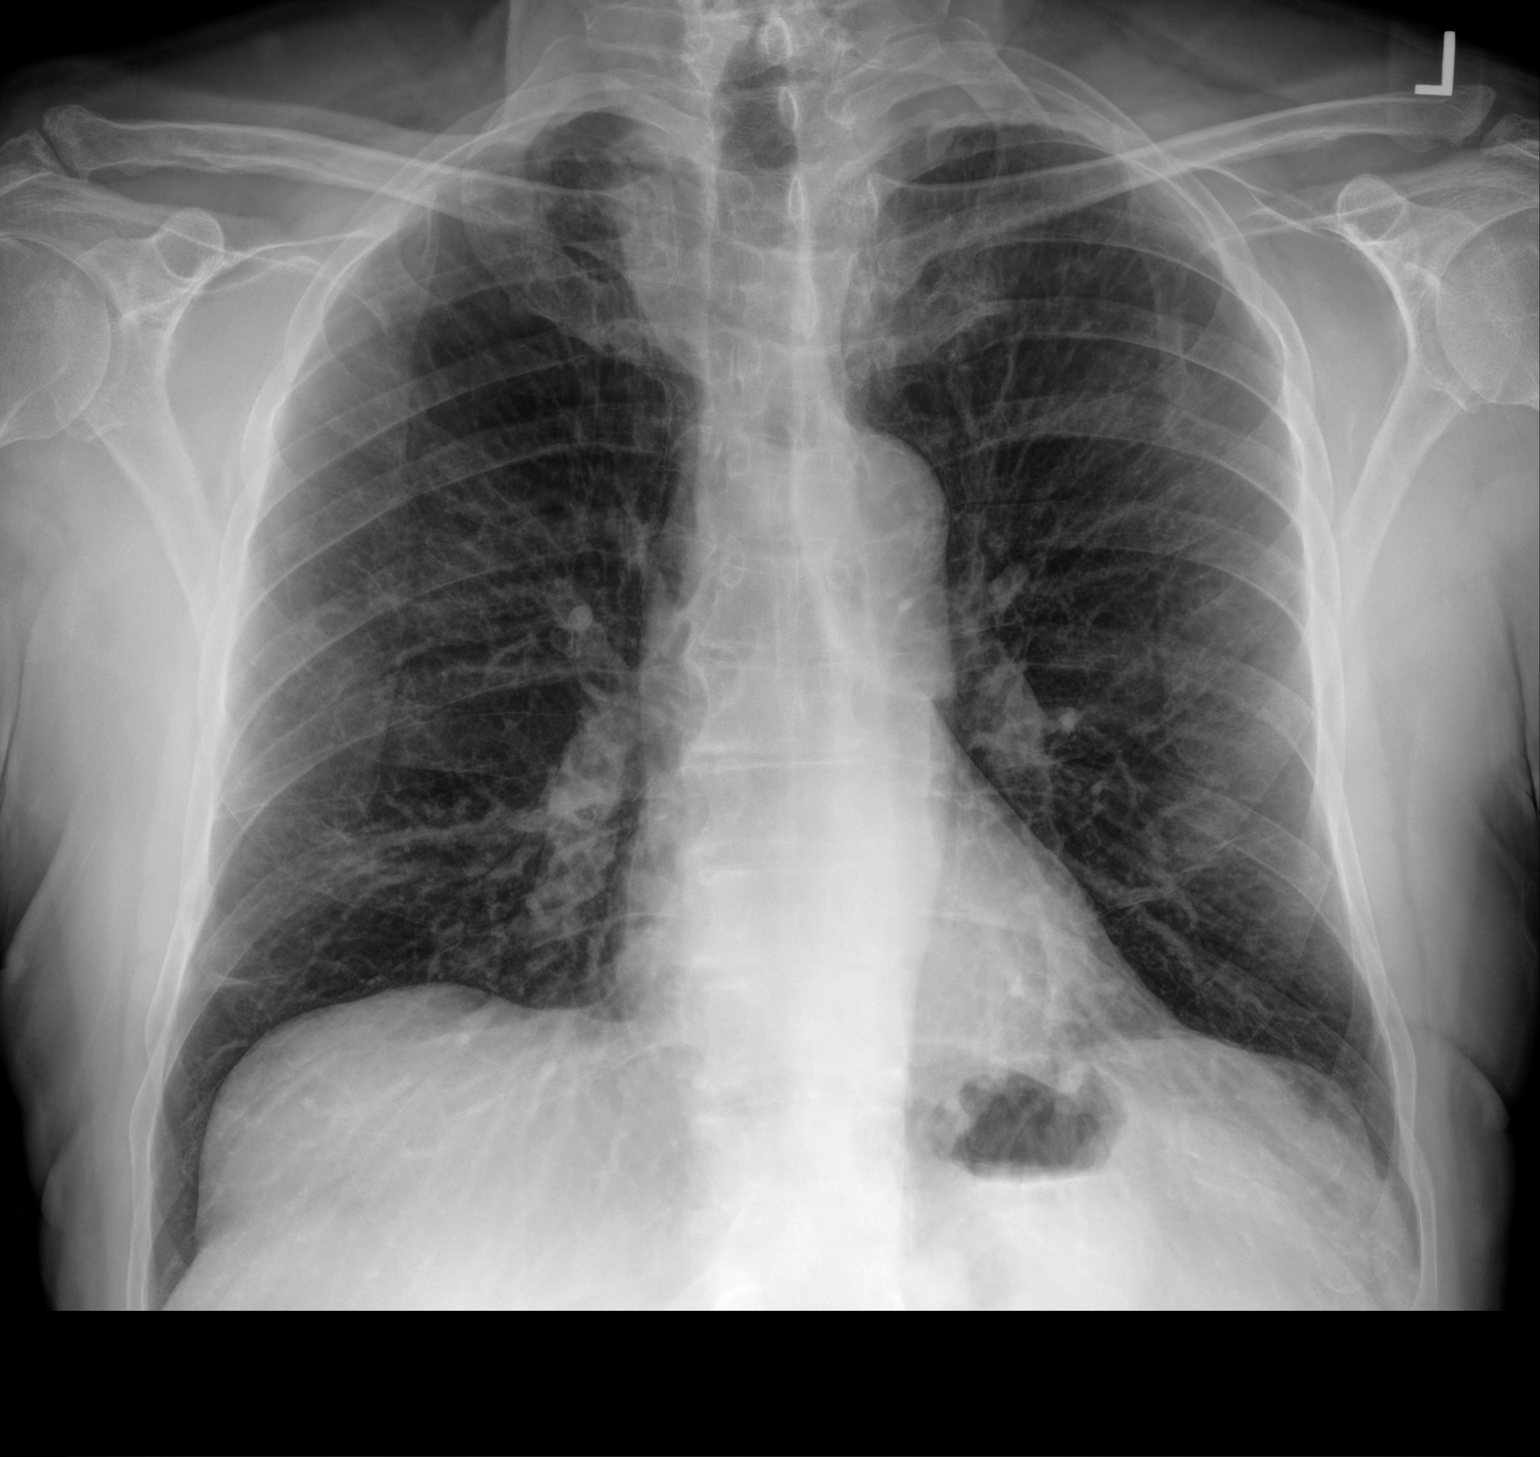

[chest lat]
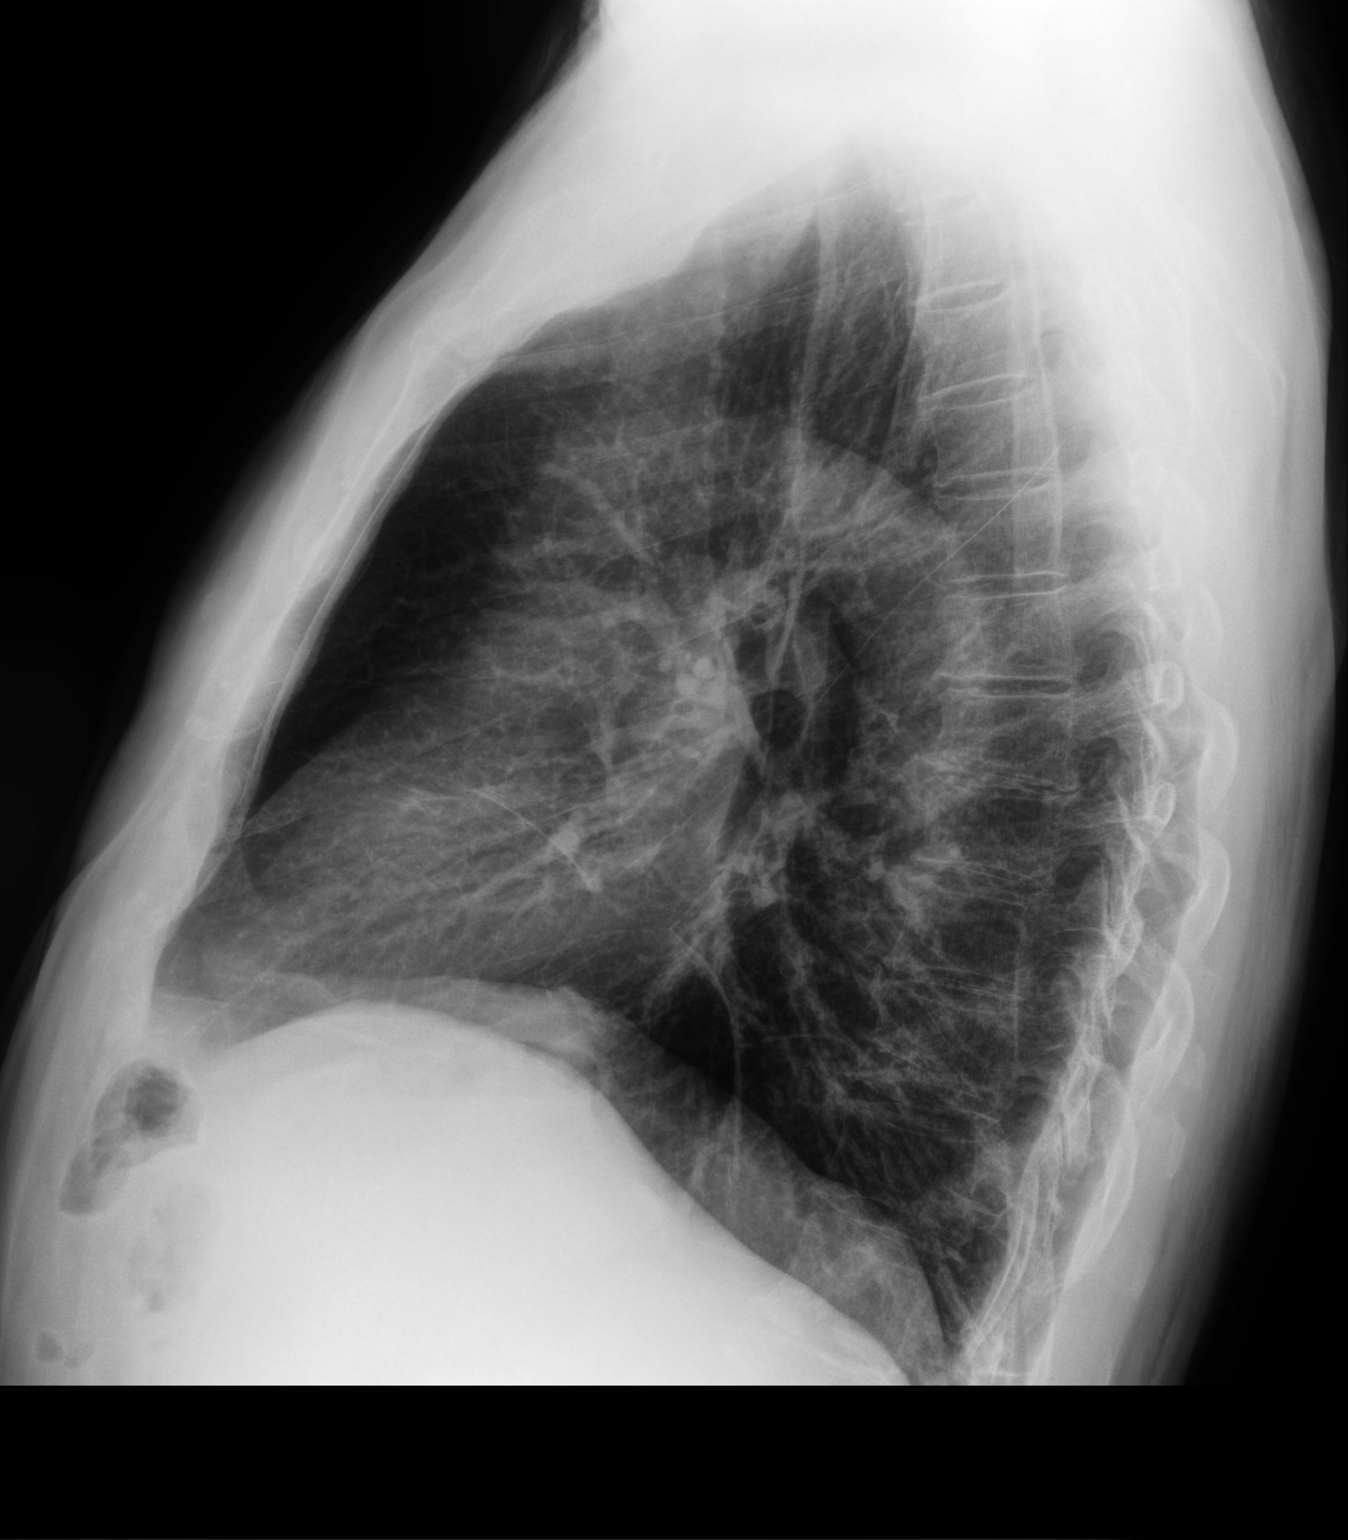

[2 of 2 positions shown; findings below may reference images not displayed]

FINDINGS: Lungs are hyperexpanded. The lungs are clear without focal
pneumonia, edema, pneumothorax or pleural effusion. Right
paratracheal opacity is stable, likely related to ectasia of arch
vessel anatomy. The cardiopericardial silhouette is within normal
limits for size. The visualized bony structures of the thorax show
no acute abnormality.
IMPRESSION: Hyperexpansion without acute cardiopulmonary findings.

## 2023-11-30 DIAGNOSIS — L570 Actinic keratosis: Secondary | ICD-10-CM | POA: Diagnosis not present

## 2023-11-30 DIAGNOSIS — Z85828 Personal history of other malignant neoplasm of skin: Secondary | ICD-10-CM | POA: Diagnosis not present

## 2023-11-30 DIAGNOSIS — D225 Melanocytic nevi of trunk: Secondary | ICD-10-CM | POA: Diagnosis not present

## 2023-11-30 DIAGNOSIS — L821 Other seborrheic keratosis: Secondary | ICD-10-CM | POA: Diagnosis not present

## 2023-11-30 DIAGNOSIS — Z808 Family history of malignant neoplasm of other organs or systems: Secondary | ICD-10-CM | POA: Diagnosis not present

## 2023-11-30 DIAGNOSIS — L814 Other melanin hyperpigmentation: Secondary | ICD-10-CM | POA: Diagnosis not present

## 2024-01-20 DIAGNOSIS — J020 Streptococcal pharyngitis: Secondary | ICD-10-CM | POA: Diagnosis not present

## 2024-01-20 DIAGNOSIS — J069 Acute upper respiratory infection, unspecified: Secondary | ICD-10-CM | POA: Diagnosis not present

## 2024-01-20 DIAGNOSIS — J029 Acute pharyngitis, unspecified: Secondary | ICD-10-CM | POA: Diagnosis not present

## 2024-01-24 DIAGNOSIS — H2513 Age-related nuclear cataract, bilateral: Secondary | ICD-10-CM | POA: Diagnosis not present

## 2024-01-24 DIAGNOSIS — H353112 Nonexudative age-related macular degeneration, right eye, intermediate dry stage: Secondary | ICD-10-CM | POA: Diagnosis not present

## 2024-01-24 DIAGNOSIS — H43812 Vitreous degeneration, left eye: Secondary | ICD-10-CM | POA: Diagnosis not present

## 2024-01-30 DIAGNOSIS — Z23 Encounter for immunization: Secondary | ICD-10-CM | POA: Diagnosis not present

## 2024-02-01 DIAGNOSIS — I10 Essential (primary) hypertension: Secondary | ICD-10-CM | POA: Diagnosis not present
